# Patient Record
Sex: Female | Born: 1970 | Race: Black or African American | Hispanic: No | Marital: Single | Smoking: Current every day smoker
Health system: Southern US, Community
[De-identification: ages and names within clinical notes are randomized; demographics above are authoritative.]

## PROBLEM LIST (undated history)

## (undated) DIAGNOSIS — F1010 Alcohol abuse, uncomplicated: Secondary | ICD-10-CM

## (undated) DIAGNOSIS — I472 Ventricular tachycardia, unspecified: Principal | ICD-10-CM

## (undated) DIAGNOSIS — I5020 Unspecified systolic (congestive) heart failure: Secondary | ICD-10-CM

## (undated) DIAGNOSIS — I10 Essential (primary) hypertension: Secondary | ICD-10-CM

## (undated) DIAGNOSIS — I2510 Atherosclerotic heart disease of native coronary artery without angina pectoris: Secondary | ICD-10-CM

## (undated) HISTORY — DX: Unspecified systolic (congestive) heart failure: I50.20

## (undated) HISTORY — DX: Atherosclerotic heart disease of native coronary artery without angina pectoris: I25.10

---

## 2000-09-22 ENCOUNTER — Emergency Department (HOSPITAL_COMMUNITY): Admission: EM | Admit: 2000-09-22 | Discharge: 2000-09-22 | Payer: Self-pay | Admitting: Emergency Medicine

## 2000-11-05 ENCOUNTER — Encounter: Payer: Self-pay | Admitting: *Deleted

## 2000-11-05 ENCOUNTER — Emergency Department (HOSPITAL_COMMUNITY): Admission: EM | Admit: 2000-11-05 | Discharge: 2000-11-05 | Payer: Self-pay | Admitting: *Deleted

## 2001-10-17 ENCOUNTER — Other Ambulatory Visit: Admission: RE | Admit: 2001-10-17 | Discharge: 2001-10-17 | Payer: Self-pay

## 2002-06-14 ENCOUNTER — Ambulatory Visit (HOSPITAL_COMMUNITY): Admission: RE | Admit: 2002-06-14 | Discharge: 2002-06-14 | Payer: Self-pay | Admitting: Obstetrics and Gynecology

## 2002-06-14 ENCOUNTER — Encounter: Payer: Self-pay | Admitting: Obstetrics and Gynecology

## 2002-07-05 ENCOUNTER — Ambulatory Visit (HOSPITAL_COMMUNITY): Admission: RE | Admit: 2002-07-05 | Discharge: 2002-07-05 | Payer: Self-pay | Admitting: General Surgery

## 2002-08-04 ENCOUNTER — Encounter: Payer: Self-pay | Admitting: *Deleted

## 2002-08-04 ENCOUNTER — Emergency Department (HOSPITAL_COMMUNITY): Admission: EM | Admit: 2002-08-04 | Discharge: 2002-08-04 | Payer: Self-pay | Admitting: *Deleted

## 2002-11-26 ENCOUNTER — Emergency Department (HOSPITAL_COMMUNITY): Admission: EM | Admit: 2002-11-26 | Discharge: 2002-11-26 | Payer: Self-pay | Admitting: Emergency Medicine

## 2002-11-26 ENCOUNTER — Encounter: Payer: Self-pay | Admitting: Emergency Medicine

## 2003-03-24 ENCOUNTER — Emergency Department (HOSPITAL_COMMUNITY): Admission: EM | Admit: 2003-03-24 | Discharge: 2003-03-24 | Payer: Self-pay | Admitting: Emergency Medicine

## 2003-08-17 ENCOUNTER — Emergency Department (HOSPITAL_COMMUNITY): Admission: EM | Admit: 2003-08-17 | Discharge: 2003-08-17 | Payer: Self-pay | Admitting: Emergency Medicine

## 2003-10-18 ENCOUNTER — Ambulatory Visit (HOSPITAL_COMMUNITY): Admission: RE | Admit: 2003-10-18 | Discharge: 2003-10-18 | Payer: Self-pay | Admitting: Obstetrics and Gynecology

## 2004-06-15 ENCOUNTER — Emergency Department (HOSPITAL_COMMUNITY): Admission: EM | Admit: 2004-06-15 | Discharge: 2004-06-15 | Payer: Self-pay | Admitting: Emergency Medicine

## 2004-07-02 ENCOUNTER — Ambulatory Visit: Payer: Self-pay | Admitting: Orthopedic Surgery

## 2004-07-07 ENCOUNTER — Encounter (HOSPITAL_COMMUNITY): Admission: RE | Admit: 2004-07-07 | Discharge: 2004-08-06 | Payer: Self-pay | Admitting: Orthopedic Surgery

## 2005-01-26 ENCOUNTER — Emergency Department (HOSPITAL_COMMUNITY): Admission: EM | Admit: 2005-01-26 | Discharge: 2005-01-26 | Payer: Self-pay | Admitting: Emergency Medicine

## 2005-04-24 ENCOUNTER — Emergency Department (HOSPITAL_COMMUNITY): Admission: EM | Admit: 2005-04-24 | Discharge: 2005-04-24 | Payer: Self-pay | Admitting: Emergency Medicine

## 2006-01-28 ENCOUNTER — Emergency Department (HOSPITAL_COMMUNITY): Admission: EM | Admit: 2006-01-28 | Discharge: 2006-01-28 | Payer: Self-pay | Admitting: Emergency Medicine

## 2006-03-29 ENCOUNTER — Emergency Department (HOSPITAL_COMMUNITY): Admission: EM | Admit: 2006-03-29 | Discharge: 2006-03-29 | Payer: Self-pay | Admitting: Emergency Medicine

## 2008-01-16 ENCOUNTER — Emergency Department (HOSPITAL_COMMUNITY): Admission: EM | Admit: 2008-01-16 | Discharge: 2008-01-16 | Payer: Self-pay | Admitting: Emergency Medicine

## 2008-05-05 ENCOUNTER — Emergency Department (HOSPITAL_COMMUNITY): Admission: EM | Admit: 2008-05-05 | Discharge: 2008-05-05 | Payer: Self-pay | Admitting: Emergency Medicine

## 2009-03-29 ENCOUNTER — Emergency Department (HOSPITAL_COMMUNITY): Admission: EM | Admit: 2009-03-29 | Discharge: 2009-03-29 | Payer: Self-pay | Admitting: Emergency Medicine

## 2009-05-02 ENCOUNTER — Emergency Department (HOSPITAL_COMMUNITY): Admission: EM | Admit: 2009-05-02 | Discharge: 2009-05-02 | Payer: Self-pay | Admitting: Emergency Medicine

## 2010-06-18 ENCOUNTER — Emergency Department (HOSPITAL_COMMUNITY)
Admission: EM | Admit: 2010-06-18 | Discharge: 2010-06-18 | Payer: Self-pay | Source: Home / Self Care | Admitting: Emergency Medicine

## 2010-06-22 LAB — URINALYSIS, ROUTINE W REFLEX MICROSCOPIC
Bilirubin Urine: NEGATIVE
Ketones, ur: NEGATIVE mg/dL
Nitrite: NEGATIVE
Specific Gravity, Urine: 1.005 (ref 1.005–1.030)
Urine Glucose, Fasting: NEGATIVE mg/dL
Urobilinogen, UA: 0.2 mg/dL (ref 0.0–1.0)
pH: 5.5 (ref 5.0–8.0)

## 2010-06-22 LAB — URINE MICROSCOPIC-ADD ON

## 2010-06-22 LAB — PREGNANCY, URINE: Preg Test, Ur: NEGATIVE

## 2010-06-28 ENCOUNTER — Encounter: Payer: Self-pay | Admitting: Obstetrics and Gynecology

## 2010-10-01 ENCOUNTER — Emergency Department (HOSPITAL_COMMUNITY): Payer: Self-pay

## 2010-10-01 ENCOUNTER — Emergency Department (HOSPITAL_COMMUNITY)
Admission: EM | Admit: 2010-10-01 | Discharge: 2010-10-01 | Disposition: A | Discharge status: Home / Self Care | Payer: Self-pay | Attending: Emergency Medicine | Admitting: Emergency Medicine

## 2010-10-01 DIAGNOSIS — Y92009 Unspecified place in unspecified non-institutional (private) residence as the place of occurrence of the external cause: Secondary | ICD-10-CM | POA: Insufficient documentation

## 2010-10-01 DIAGNOSIS — S2239XA Fracture of one rib, unspecified side, initial encounter for closed fracture: Secondary | ICD-10-CM | POA: Insufficient documentation

## 2010-10-01 DIAGNOSIS — R079 Chest pain, unspecified: Secondary | ICD-10-CM | POA: Insufficient documentation

## 2010-10-01 DIAGNOSIS — W1809XA Striking against other object with subsequent fall, initial encounter: Secondary | ICD-10-CM | POA: Insufficient documentation

## 2010-10-23 NOTE — H&P (Signed)
   NAME:  Erika Kelley, Erika Kelley NO.:  [id number]   MEDICAL RECORD NO.:  [id number]                   PATIENT TYPE:   LOCATION:                                       FACILITY:   PHYSICIAN:  Dalia Heading, M.D.               DATE OF BIRTH:  1971/01/29   DATE OF ADMISSION:  DATE OF DISCHARGE:                                HISTORY & PHYSICAL   CHIEF COMPLAINT:  Right breast mass.   HISTORY OF PRESENT ILLNESS:  The patient is a 40 year old black female who  is referred for evaluation and treatment of a right breast mass.  It has  been present for over a year.  It is becoming increasingly tender.  Some  enlargement is noted.  No family history of breast carcinoma is noted.  She  was first pregnant at age 38, had two children, never breast fed.  No nipple  discharge is noted.   PAST MEDICAL HISTORY:  Unremarkable.   PAST SURGICAL HISTORY:  Unremarkable.   CURRENT MEDICATIONS:  None.   ALLERGIES:  No known drug allergies.   REVIEW OF SYSTEMS:  The patient does smoke a half pack of cigarettes a day.  She denies any significant alcohol use.  She denies any other  cardiopulmonary difficulties or bleeding disorders.   PHYSICAL EXAMINATION:  GENERAL:  The patient is a well-developed, well-  nourished black female in no acute distress.  VITAL SIGNS:  She is afebrile and vital signs are stable.  LUNGS:  Clear to auscultation with equal breath sounds bilaterally.  HEART:  Regular rate and rhythm without S3, S4, or murmurs.  BREASTS:  Right breast examination reveals a 1-cm ovoid subcutaneous nodule  noted along the inferior mammary fold.  No nipple discharge or dimpling is  noted.  The axilla is negative for palpable nodes.  Left breast examination  was unremarkable.   LABORATORY DATA:  Ultrasound of the area reveals a nonspecific subcutaneous  nodule along the inferior mammary fold.   IMPRESSION:  Right breast mass.    PLAN:  The patient is scheduled for  a right breast biopsy on July 05, 2002.  The risks and benefits of the procedure including bleeding and  infection were fully explained to the patient, gave informed consent.                                               Dalia Heading, M.D.    MAJ/MEDQ  D:  06/28/2002  T:  06/28/2002  Job:  [id number]   cc:   Health Department

## 2010-10-23 NOTE — Op Note (Signed)
   NAME:  Erika Kelley, Erika Kelley                      ACCOUNT NO.:  [id number]   MEDICAL RECORD NO.:  [id number]                   PATIENT TYPE:  AMB   LOCATION:  DAY                                  FACILITY:  [facility]   PHYSICIAN:  Dalia Heading, M.D.               DATE OF BIRTH:  [birth year]   DATE OF PROCEDURE:  07/05/2002  DATE OF DISCHARGE:                                 OPERATIVE REPORT   PREOPERATIVE DIAGNOSIS:  Right breast mass.   POSTOPERATIVE DIAGNOSIS:  Right breast mass.   PROCEDURE:  Right breast biopsy.   SURGEON:  Dalia Heading, M.D.   ANESTHESIA:  MAC.   INDICATIONS:  The patient is a 40 year old black female who presents with a  dominant mass along the inframammary fold of the right breast at the 6  o'clock position.  The risks and benefits of the procedure, including  bleeding and infection were fully explained to the patient, gained informed  consent.   PROCEDURE NOTE:  The patient was placed in the supine position.  The right  breast was prepped and draped using the usual sterile technique with  Betadine.  Surgical site confirmation was performed.   An incision was made along the inframammary fold at the 6 o'clock position  of the right breast.  The mass was excised without difficulty.  It appeared  to be necrotic breast tissue.  The specimen was sent to pathology for  further examination.  Any bleeding was controlled using Bovie  electrocautery.  The skin was closed using a 4-0 Vicryl subcuticular suture.   1% Xylocaine was used for local anesthesia.  Steri-Strips and a dry, sterile  dressing were applied.   All tape and needle counts were correct at the end of the procedure.  The  patient was transferred to the day surgery in stable condition.   COMPLICATIONS:  None.    SPECIMENS:  Right breast mass.   BLOOD LOSS:  Minimal.                                               Dalia Heading, M.D.    MAJ/MEDQ  D:  07/05/2002  T:  07/05/2002  Job:   [id number]   cc:   Health Department

## 2010-12-29 ENCOUNTER — Emergency Department (HOSPITAL_COMMUNITY)
Admission: EM | Admit: 2010-12-29 | Discharge: 2010-12-29 | Disposition: A | Discharge status: Home / Self Care | Payer: Self-pay | Attending: Emergency Medicine | Admitting: Emergency Medicine

## 2010-12-29 ENCOUNTER — Emergency Department (HOSPITAL_COMMUNITY): Payer: Self-pay

## 2010-12-29 DIAGNOSIS — R51 Headache: Secondary | ICD-10-CM | POA: Insufficient documentation

## 2010-12-29 DIAGNOSIS — S5000XA Contusion of unspecified elbow, initial encounter: Secondary | ICD-10-CM | POA: Insufficient documentation

## 2010-12-29 DIAGNOSIS — F17200 Nicotine dependence, unspecified, uncomplicated: Secondary | ICD-10-CM | POA: Insufficient documentation

## 2010-12-29 DIAGNOSIS — S40029A Contusion of unspecified upper arm, initial encounter: Secondary | ICD-10-CM | POA: Insufficient documentation

## 2010-12-29 DIAGNOSIS — M79609 Pain in unspecified limb: Secondary | ICD-10-CM | POA: Insufficient documentation

## 2010-12-29 DIAGNOSIS — M25529 Pain in unspecified elbow: Secondary | ICD-10-CM | POA: Insufficient documentation

## 2012-03-30 ENCOUNTER — Encounter (HOSPITAL_COMMUNITY): Payer: Self-pay | Admitting: Emergency Medicine

## 2012-03-30 ENCOUNTER — Emergency Department (HOSPITAL_COMMUNITY): Payer: Medicaid Other

## 2012-03-30 ENCOUNTER — Emergency Department (HOSPITAL_COMMUNITY)
Admission: EM | Admit: 2012-03-30 | Discharge: 2012-03-30 | Disposition: A | Discharge status: Home / Self Care | Payer: Medicaid Other | Attending: Emergency Medicine | Admitting: Emergency Medicine

## 2012-03-30 DIAGNOSIS — J40 Bronchitis, not specified as acute or chronic: Secondary | ICD-10-CM

## 2012-03-30 DIAGNOSIS — Z9889 Other specified postprocedural states: Secondary | ICD-10-CM | POA: Insufficient documentation

## 2012-03-30 DIAGNOSIS — F17200 Nicotine dependence, unspecified, uncomplicated: Secondary | ICD-10-CM | POA: Insufficient documentation

## 2012-03-30 DIAGNOSIS — M549 Dorsalgia, unspecified: Secondary | ICD-10-CM | POA: Insufficient documentation

## 2012-03-30 LAB — URINALYSIS, ROUTINE W REFLEX MICROSCOPIC
Bilirubin Urine: NEGATIVE
Glucose, UA: NEGATIVE mg/dL
Ketones, ur: NEGATIVE mg/dL
Nitrite: NEGATIVE
Protein, ur: NEGATIVE mg/dL
Specific Gravity, Urine: 1.02 (ref 1.005–1.030)
Urobilinogen, UA: 0.2 mg/dL (ref 0.0–1.0)
pH: 6.5 (ref 5.0–8.0)

## 2012-03-30 LAB — COMPREHENSIVE METABOLIC PANEL
ALT: 13 U/L (ref 0–35)
AST: 15 U/L (ref 0–37)
Albumin: 3.8 g/dL (ref 3.5–5.2)
Alkaline Phosphatase: 71 U/L (ref 39–117)
BUN: 9 mg/dL (ref 6–23)
CO2: 23 mEq/L (ref 19–32)
Calcium: 9.1 mg/dL (ref 8.4–10.5)
Chloride: 101 mEq/L (ref 96–112)
Creatinine, Ser: 0.69 mg/dL (ref 0.50–1.10)
GFR calc Af Amer: >90 mL/min (ref >90)
GFR calc non Af Amer: >90 mL/min (ref >90)
Glucose, Bld: 87 mg/dL (ref 70–99)
Potassium: 3.5 mEq/L (ref 3.5–5.1)
Sodium: 135 mEq/L (ref 135–145)
Total Bilirubin: 0.3 mg/dL (ref 0.3–1.2)
Total Protein: 8.4 g/dL — ABNORMAL HIGH (ref 6.0–8.3)

## 2012-03-30 LAB — CBC WITH DIFFERENTIAL/PLATELET
Basophils Absolute: 0 10*3/uL (ref 0.0–0.1)
Basophils Relative: 0 % (ref 0–1)
Eosinophils Absolute: 0.1 10*3/uL (ref 0.0–0.7)
Eosinophils Relative: 1 % (ref 0–5)
HCT: 40.4 % (ref 36.0–46.0)
Hemoglobin: 13.4 g/dL (ref 12.0–15.0)
Lymphocytes Relative: 26 % (ref 12–46)
Lymphs Abs: 2.8 10*3/uL (ref 0.7–4.0)
MCH: 26.7 pg (ref 26.0–34.0)
MCHC: 33.2 g/dL (ref 30.0–36.0)
MCV: 80.5 fL (ref 78.0–100.0)
Monocytes Absolute: 1.3 10*3/uL — ABNORMAL HIGH (ref 0.1–1.0)
Monocytes Relative: 12 % (ref 3–12)
Neutro Abs: 6.7 10*3/uL (ref 1.7–7.7)
Neutrophils Relative %: 61 % (ref 43–77)
Platelets: 297 10*3/uL (ref 150–400)
RBC: 5.02 MIL/uL (ref 3.87–5.11)
RDW: 13.3 % (ref 11.5–15.5)
WBC: 10.9 10*3/uL — ABNORMAL HIGH (ref 4.0–10.5)

## 2012-03-30 LAB — LIPASE, BLOOD: Lipase: 45 U/L (ref 11–59)

## 2012-03-30 LAB — D-DIMER, QUANTITATIVE: D-Dimer, Quant: 0.68 ug/mL-FEU — ABNORMAL HIGH (ref 0.00–0.48)

## 2012-03-30 LAB — URINE MICROSCOPIC-ADD ON

## 2012-03-30 LAB — PREGNANCY, URINE: Preg Test, Ur: NEGATIVE

## 2012-03-30 MED ORDER — KETOROLAC TROMETHAMINE 30 MG/ML IJ SOLN
30.0000 mg | Freq: Once | INTRAMUSCULAR | Status: AC
  Administered 2012-03-30: 30 mg via INTRAVENOUS
  Filled 2012-03-30: qty 1

## 2012-03-30 MED ORDER — NAPROXEN 500 MG PO TABS: 500.0000 mg | ORAL_TABLET | Freq: Two times a day (BID) | ORAL | Status: DC

## 2012-03-30 MED ORDER — ONDANSETRON HCL 4 MG/2ML IJ SOLN
4.0000 mg | Freq: Once | INTRAMUSCULAR | Status: AC
  Administered 2012-03-30: 4 mg via INTRAVENOUS
  Filled 2012-03-30: qty 2

## 2012-03-30 MED ORDER — AZITHROMYCIN 250 MG PO TABS
500.0000 mg | ORAL_TABLET | Freq: Once | ORAL | Status: AC
  Administered 2012-03-30: 500 mg via ORAL
  Filled 2012-03-30: qty 2
  Filled 2012-03-30: qty 1

## 2012-03-30 MED ORDER — MORPHINE SULFATE 4 MG/ML IJ SOLN
4.0000 mg | Freq: Once | INTRAMUSCULAR | Status: AC
  Administered 2012-03-30: 4 mg via INTRAVENOUS
  Filled 2012-03-30: qty 1

## 2012-03-30 MED ORDER — HYDROCODONE-ACETAMINOPHEN 5-325 MG PO TABS: ORAL_TABLET | ORAL | Status: DC

## 2012-03-30 MED ORDER — SODIUM CHLORIDE 0.9 % IV SOLN
Freq: Once | INTRAVENOUS | Status: AC
  Administered 2012-03-30: 20:00:00 via INTRAVENOUS

## 2012-03-30 MED ORDER — AZITHROMYCIN 250 MG PO TABS: ORAL_TABLET | ORAL | Status: DC

## 2012-03-30 NOTE — ED Notes (Signed)
Patient complaining of sharp pains in LUQ. Reports pains started this morning and radiate to back.

## 2012-03-30 NOTE — ED Provider Notes (Signed)
History     CSN: [id number]  Arrival date & time 03/30/12  Erika Kelley   First MD Initiated Contact with Patient 03/30/12 1934      Chief Complaint  Patient presents with  . Abdominal Pain    (Consider location/radiation/quality/duration/timing/severity/associated sxs/prior treatment) HPI Comments: Patient c/o sudden onset of LUQ and left rib pain that began suddenly this morning.  Describes the pain as sharp and stabbing and worse with movement and deep breathing.  Also states the pain radiates into her back.  She states she recently had a "cold" with frequent coughing. Cough has subsided, but still reports having an occasional cough.  She denies fever, vomiting, nausea or diarrhea.  Pain is not associated with food intake.  She also denies chest pain, sweating or shortness of breath.  She admits to occasional alcohol use.  Patient is a 41 y.o. female presenting with abdominal pain. The history is provided by the patient.  Abdominal Pain The primary symptoms of the illness include abdominal pain. The primary symptoms of the illness do not include fever, fatigue, shortness of breath, nausea, vomiting, diarrhea, hematemesis, hematochezia, dysuria, vaginal discharge or vaginal bleeding. The current episode started 6 to 12 hours ago. The onset of the illness was sudden. The problem has not changed since onset. The pain came on suddenly. The abdominal pain has been unchanged since its onset. The abdominal pain is located in the LUQ. The abdominal pain radiates to the back. The abdominal pain is relieved by nothing. Exacerbated by: nothing.  The patient states that she believes she is currently not pregnant. The patient has not had a change in bowel habit. Additional symptoms associated with the illness include back pain. Symptoms associated with the illness do not include chills, anorexia, heartburn, constipation, urgency, hematuria or frequency.    History reviewed. No pertinent past medical  history.  Past Surgical History  Procedure Date  . Cesarean section     History reviewed. No pertinent family history.  History  Substance Use Topics  . Smoking status: Current Every Day Smoker  . Smokeless tobacco: Not on file  . Alcohol Use: Yes    OB History    Grav Para Term Preterm Abortions TAB SAB Ect Mult Living                  Review of Systems  Constitutional: Negative for fever, chills, appetite change and fatigue.  Respiratory: Negative for chest tightness and shortness of breath.   Cardiovascular: Negative for chest pain.  Gastrointestinal: Positive for abdominal pain. Negative for heartburn, nausea, vomiting, diarrhea, constipation, blood in stool, hematochezia, anorexia and hematemesis.  Genitourinary: Negative for dysuria, urgency, frequency, hematuria, flank pain, decreased urine volume, vaginal bleeding, vaginal discharge and difficulty urinating.  Musculoskeletal: Positive for back pain.  Skin: Negative for color change and rash.  Neurological: Negative for dizziness, weakness and numbness.  Hematological: Negative for adenopathy.  All other systems reviewed and are negative.    Allergies  Review of patient's allergies indicates no known allergies.  Home Medications  No current outpatient prescriptions on file.  BP 142/90  Pulse 109  Temp 99 F (37.2 C) (Oral)  Resp 18  SpO2 95%  LMP 03/16/2012  Physical Exam  Nursing note and vitals reviewed. Constitutional: She is oriented to person, place, and time. She appears well-developed and well-nourished. No distress.  HENT:  Head: Normocephalic and atraumatic.  Mouth/Throat: Oropharynx is clear and moist.  Cardiovascular: Normal rate, regular rhythm, normal heart sounds and intact  distal pulses.   No murmur heard. Pulmonary/Chest: Effort normal. No respiratory distress. She has no wheezes. She has no rales. She exhibits tenderness.       ttp of the lower anterior left ribs and left lateral  ribs.  No crepitus or guarding.  Slightly course lung sounds bilaterally  Abdominal: Soft. Bowel sounds are normal. She exhibits no distension and no mass. There is no hepatosplenomegaly. There is tenderness in the left upper quadrant. There is no rigidity, no rebound, no guarding and no CVA tenderness.         Mild ttp of the left upper quadrant.  No right sided tenderness or lower abdominal tenderness.  No guarding or rebound tenderness.    Musculoskeletal: She exhibits no edema.  Neurological: She is alert and oriented to person, place, and time.  Skin: Skin is warm and dry.    ED Course  Procedures (including critical care time)    Results for orders placed during the hospital encounter of 03/30/12  CBC WITH DIFFERENTIAL      Component Value Range   WBC 10.9 (*) 4.0 - 10.5 K/uL   RBC 5.02  3.87 - 5.11 MIL/uL   Hemoglobin 13.4  12.0 - 15.0 g/dL   HCT 16.1  09.6 - 04.5 %   MCV 80.5  78.0 - 100.0 fL   MCH 26.7  26.0 - 34.0 pg   MCHC 33.2  30.0 - 36.0 g/dL   RDW 40.9  81.1 - 91.4 %   Platelets 297  150 - 400 K/uL   Neutrophils Relative 61  43 - 77 %   Neutro Abs 6.7  1.7 - 7.7 K/uL   Lymphocytes Relative 26  12 - 46 %   Lymphs Abs 2.8  0.7 - 4.0 K/uL   Monocytes Relative 12  3 - 12 %   Monocytes Absolute 1.3 (*) 0.1 - 1.0 K/uL   Eosinophils Relative 1  0 - 5 %   Eosinophils Absolute 0.1  0.0 - 0.7 K/uL   Basophils Relative 0  0 - 1 %   Basophils Absolute 0.0  0.0 - 0.1 K/uL  COMPREHENSIVE METABOLIC PANEL      Component Value Range   Sodium 135  135 - 145 mEq/L   Potassium 3.5  3.5 - 5.1 mEq/L   Chloride 101  96 - 112 mEq/L   CO2 23  19 - 32 mEq/L   Glucose, Bld 87  70 - 99 mg/dL   BUN 9  6 - 23 mg/dL   Creatinine, Ser 7.82  0.50 - 1.10 mg/dL   Calcium 9.1  8.4 - 95.6 mg/dL   Total Protein 8.4 (*) 6.0 - 8.3 g/dL   Albumin 3.8  3.5 - 5.2 g/dL   AST 15  0 - 37 U/L   ALT 13  0 - 35 U/L   Alkaline Phosphatase 71  39 - 117 U/L   Total Bilirubin 0.3  0.3 - 1.2 mg/dL    GFR calc non Af Amer >90  >90 mL/min   GFR calc Af Amer >90  >90 mL/min  LIPASE, BLOOD      Component Value Range   Lipase 45  11 - 59 U/L  URINALYSIS, ROUTINE W REFLEX MICROSCOPIC      Component Value Range   Color, Urine YELLOW  YELLOW   APPearance CLEAR  CLEAR   Specific Gravity, Urine 1.020  1.005 - 1.030   pH 6.5  5.0 - 8.0   Glucose, UA NEGATIVE  NEGATIVE mg/dL   Hgb urine dipstick TRACE (*) NEGATIVE   Bilirubin Urine NEGATIVE  NEGATIVE   Ketones, ur NEGATIVE  NEGATIVE mg/dL   Protein, ur NEGATIVE  NEGATIVE mg/dL   Urobilinogen, UA 0.2  0.0 - 1.0 mg/dL   Nitrite NEGATIVE  NEGATIVE   Leukocytes, UA TRACE (*) NEGATIVE  PREGNANCY, URINE      Component Value Range   Preg Test, Ur NEGATIVE  NEGATIVE  URINE MICROSCOPIC-ADD ON      Component Value Range   Squamous Epithelial / LPF MANY (*) RARE   WBC, UA 3-6  <3 WBC/hpf   RBC / HPF 0-2  <3 RBC/hpf   Bacteria, UA FEW (*) RARE  D-DIMER, QUANTITATIVE      Component Value Range   D-Dimer, Quant 0.68 (*) 0.00 - 0.48 ug/mL-FEU     Dg Chest 2 View  03/30/2012  *RADIOLOGY REPORT*  Clinical Data: Left upper quadrant abdominal pain.  CHEST - 2 VIEW  Comparison: Two-view chest 10/01/2010.  Findings: The heart size is normal.  Bilateral infrahilar bronchitic changes are evident.  Bibasilar atelectasis is evident. Early infection is not excluded.  The upper lung fields are clear. The visualized soft tissues and bony thorax are unremarkable.  IMPRESSION:  1.  Bilateral infrahilar bronchitic change.  Part of this may be chronic although there has been some progression and acute bronchitis is suspected. 2.  Bibasilar airspace disease likely reflects atelectasis.  Early bronchopneumonia is not excluded.   Original Report Authenticated By: Jamesetta Orleans. MATTERN, M.D.     MDM    Patient is resting comfortably, feeling better.  Lung sounds were slightly coarse but improve after cough.   No wheezing or rales. Doubt acute abdomen.  Labs and imaging  reviewed, discussed pt hx and results with EDP.    D-Dimer is slightly elevated, but clinical suspicion for PE is low.  Pt is a smoker but no other significant risk factors.  Wells score is low probability for PE   Pt agrees to close f/u with her PMD or to return here if her sx's worsen.  Prescribed: zithromax Naprosyn norco #20    Erika Kelley L. [place], [place] 04/01/12 9147

## 2012-03-30 NOTE — ED Notes (Signed)
Patient c/o sharp pain in upper left quadrant. States that it is worse when she takes a breath, and hurts with certain movements, tender to palpate. Patient has no complaint of n/v, states that it just started this morning and has continued to get worse.

## 2012-04-01 LAB — URINE CULTURE: Colony Count: >=100000

## 2012-04-06 NOTE — ED Provider Notes (Signed)
Medical screening examination/treatment/procedure(s) were performed by non-physician practitioner and as supervising physician I was immediately available for consultation/collaboration. Devoria Albe, MD, Armando Gang   Ward Givens, MD 04/06/12 [phone]

## 2013-03-01 ENCOUNTER — Encounter (HOSPITAL_COMMUNITY): Payer: Self-pay

## 2013-03-01 ENCOUNTER — Emergency Department (HOSPITAL_COMMUNITY)
Admission: EM | Admit: 2013-03-01 | Discharge: 2013-03-01 | Disposition: A | Discharge status: Home / Self Care | Payer: Self-pay | Attending: Emergency Medicine | Admitting: Emergency Medicine

## 2013-03-01 ENCOUNTER — Emergency Department (HOSPITAL_COMMUNITY): Payer: Self-pay

## 2013-03-01 DIAGNOSIS — Y929 Unspecified place or not applicable: Secondary | ICD-10-CM | POA: Insufficient documentation

## 2013-03-01 DIAGNOSIS — S62339A Displaced fracture of neck of unspecified metacarpal bone, initial encounter for closed fracture: Secondary | ICD-10-CM | POA: Insufficient documentation

## 2013-03-01 DIAGNOSIS — F17200 Nicotine dependence, unspecified, uncomplicated: Secondary | ICD-10-CM | POA: Insufficient documentation

## 2013-03-01 DIAGNOSIS — IMO0002 Reserved for concepts with insufficient information to code with codable children: Secondary | ICD-10-CM | POA: Insufficient documentation

## 2013-03-01 DIAGNOSIS — Y9389 Activity, other specified: Secondary | ICD-10-CM | POA: Insufficient documentation

## 2013-03-01 MED ORDER — IBUPROFEN 600 MG PO TABS: 600.0000 mg | ORAL_TABLET | Freq: Four times a day (QID) | ORAL | Status: DC | PRN

## 2013-03-01 MED ORDER — HYDROCODONE-ACETAMINOPHEN 5-325 MG PO TABS: 1.0000 | ORAL_TABLET | ORAL | Status: DC | PRN

## 2013-03-01 MED ORDER — HYDROCODONE-ACETAMINOPHEN 5-325 MG PO TABS
1.0000 | ORAL_TABLET | Freq: Once | ORAL | Status: AC
  Administered 2013-03-01: 1 via ORAL
  Filled 2013-03-01: qty 1

## 2013-03-01 NOTE — ED Notes (Signed)
Pt reports was trying to break up 2 fighting dogs last night and r hand started hurting and swelling.  Pt says doesn't remember hitting her hand on anything.

## 2013-03-01 NOTE — ED Notes (Signed)
Patient with no complaints at this time. Respirations even and unlabored. Skin warm/dry. Discharge instructions reviewed with patient at this time. Patient given opportunity to voice concerns/ask questions. Patient discharged at this time and left Emergency Department with steady gait.   

## 2013-03-01 NOTE — ED Provider Notes (Signed)
CSN: [id number]     Arrival date & time 03/01/13  1001 History   First MD Initiated Contact with Patient 03/01/13 1020     Chief Complaint  Patient presents with  . Hand Pain   (Consider location/radiation/quality/duration/timing/severity/associated sxs/prior Treatment) HPI Comments: Erika Kelley is a 42 y.o. Female presenting with pain and swelling in her right hand which occurred when she hit her hand against a tree in her yard as she was trying to keep her pit bulls from fighting.  She reports increased pain and swelling despite using ibuprofen for pain relief.  She denies numbness in her hand or fingertips but has increased pain with attempts to flex and extend her fingers.  Pain is constant and worse with movement.  She has found no alleviators of her pain.      The history is provided by the patient.    History reviewed. No pertinent past medical history. Past Surgical History  Procedure Laterality Date  . Cesarean section     No family history on file. History  Substance Use Topics  . Smoking status: Current Every Day Smoker  . Smokeless tobacco: Not on file  . Alcohol Use: Yes     Comment: occ   OB History   Grav Para Term Preterm Abortions TAB SAB Ect Mult Living                 Review of Systems  Constitutional: Negative for fever.  Musculoskeletal: Positive for joint swelling and arthralgias. Negative for myalgias.  Neurological: Negative for weakness and numbness.    Allergies  Review of patient's allergies indicates no known allergies.  Home Medications   Current Outpatient Rx  Name  Route  Sig  Dispense  Refill  . HYDROcodone-acetaminophen (NORCO/VICODIN) 5-325 MG per tablet   Oral   Take 1 tablet by mouth every 4 (four) hours as needed for pain.   30 tablet   0   . ibuprofen (ADVIL,MOTRIN) 600 MG tablet   Oral   Take 1 tablet (600 mg total) by mouth every 6 (six) hours as needed for pain.   20 tablet   0    BP 119/91  Pulse 128   Temp(Src) 98.8 F (37.1 C) (Oral)  Resp 18  Ht 5\' 6"  (1.676 m)  Wt 170 lb (77.111 kg)  BMI 27.45 kg/m2  SpO2 97%  LMP 02/06/2013 Physical Exam  Constitutional: She appears well-developed and well-nourished.  HENT:  Head: Atraumatic.  Neck: Normal range of motion.  Cardiovascular:  Pulses:      Radial pulses are 2+ on the right side, and 2+ on the left side.  Pulses equal bilaterally, less than 3 second cap refill in fingertips.  Musculoskeletal: She exhibits edema and tenderness.       Hands: Neurological: She is alert. She has normal strength. She displays normal reflexes. No sensory deficit.  Equal strength  Skin: Skin is warm and dry.  Psychiatric: She has a normal mood and affect.    ED Course  Procedures (including critical care time) Labs Review Labs Reviewed - No data to display Imaging Review Dg Hand Complete Right  03/01/2013   CLINICAL DATA:  Right hand injury.  EXAM: RIGHT HAND - COMPLETE 3+ VIEW  COMPARISON:  None.  FINDINGS: There is a boxer's type fracture involving the 5th metacarpal neck with dorsal apex angulation. The remaining bony structures are intact. The joint spaces are maintained.  IMPRESSION: Boxer's type fracture involving the 5th metacarpal neck.  Electronically Signed   By: Loralie Champagne M.D.   On: 03/01/2013 10:45    MDM   1. Boxer's fracture, closed, initial encounter    Patient was placed in the ulnar gutter splint.  She was prescribed hydrocodone and ibuprofen.  Encouraged ice and elevation for the next several days to help minimize swelling.  She was referred to Dr. Romeo Apple for followup care of this injury.  She was examined post splint application.  She had less than 3 second cap refill, no increased pain after application of splint.    Burgess Amor, PA-C 03/01/13 1059

## 2013-03-02 NOTE — ED Provider Notes (Signed)
Medical screening examination/treatment/procedure(s) were conducted as a shared visit with non-physician practitioner(s) and myself.  I personally evaluated the patient during the encounter.   Right-sided boxer's fracture. Neurovascular intact. Splint, ice, pain medicine, orthopedic followup  Donnetta Hutching, MD 03/02/13 1537

## 2013-03-03 ENCOUNTER — Encounter (HOSPITAL_COMMUNITY): Payer: Self-pay

## 2013-03-03 ENCOUNTER — Emergency Department (HOSPITAL_COMMUNITY)
Admission: EM | Admit: 2013-03-03 | Discharge: 2013-03-03 | Disposition: A | Discharge status: Home / Self Care | Payer: Self-pay | Attending: Emergency Medicine | Admitting: Emergency Medicine

## 2013-03-03 DIAGNOSIS — F17200 Nicotine dependence, unspecified, uncomplicated: Secondary | ICD-10-CM | POA: Insufficient documentation

## 2013-03-03 DIAGNOSIS — S5290XD Unspecified fracture of unspecified forearm, subsequent encounter for closed fracture with routine healing: Secondary | ICD-10-CM | POA: Insufficient documentation

## 2013-03-03 DIAGNOSIS — G8911 Acute pain due to trauma: Secondary | ICD-10-CM | POA: Insufficient documentation

## 2013-03-03 DIAGNOSIS — M79641 Pain in right hand: Secondary | ICD-10-CM

## 2013-03-03 DIAGNOSIS — S62309S Unspecified fracture of unspecified metacarpal bone, sequela: Secondary | ICD-10-CM

## 2013-03-03 MED ORDER — OXYCODONE-ACETAMINOPHEN 5-325 MG PO TABS: 1.0000 | ORAL_TABLET | ORAL | Status: DC | PRN

## 2013-03-03 MED ORDER — OXYCODONE-ACETAMINOPHEN 5-325 MG PO TABS
1.0000 | ORAL_TABLET | Freq: Once | ORAL | Status: AC
  Administered 2013-03-03: 1 via ORAL
  Filled 2013-03-03: qty 1

## 2013-03-03 NOTE — ED Provider Notes (Signed)
CSN: [id number]     Arrival date & time 03/03/13  0909 History   First MD Initiated Contact with Patient 03/03/13 [phone]     Chief Complaint  Patient presents with  . Hand Pain   (Consider location/radiation/quality/duration/timing/severity/associated sxs/prior Treatment) HPI  Erika Kelley is a 42 y.o. female who presents to the ED for pain in her right hand. She was evaluated here 2 days ago and x-ray showed a boxers fracture. Today she complains of increased pain and swelling. She has an appointment with Dr. Romeo Apple on Monday, 03/05/13.    History reviewed. No pertinent past medical history. Past Surgical History  Procedure Laterality Date  . Cesarean section     History reviewed. No pertinent family history. History  Substance Use Topics  . Smoking status: Current Every Day Smoker  . Smokeless tobacco: Not on file  . Alcohol Use: Yes     Comment: occ   OB History   Grav Para Term Preterm Abortions TAB SAB Ect Mult Living                 Review of Systems  Constitutional: Negative for fever.  HENT: Negative for neck pain.   Gastrointestinal: Negative for nausea and vomiting.  Musculoskeletal:       Right hand pain  Skin: Negative for wound.  Psychiatric/Behavioral: The patient is not nervous/anxious.     Allergies  Review of patient's allergies indicates no known allergies.  Home Medications   Current Outpatient Rx  Name  Route  Sig  Dispense  Refill  . HYDROcodone-acetaminophen (NORCO/VICODIN) 5-325 MG per tablet   Oral   Take 1 tablet by mouth every 4 (four) hours as needed for pain.   30 tablet   0   . ibuprofen (ADVIL,MOTRIN) 600 MG tablet   Oral   Take 1 tablet (600 mg total) by mouth every 6 (six) hours as needed for pain.   20 tablet   0    BP 152/94  Pulse 65  Temp(Src) 98.2 F (36.8 C) (Oral)  Resp 17  Ht 5\' 6"  (1.676 m)  Wt 170 lb (77.111 kg)  BMI 27.45 kg/m2  SpO2 100%  LMP 02/06/2013 Physical Exam  Nursing note and vitals  reviewed. Constitutional: She is oriented to person, place, and time. She appears well-developed and well-nourished. No distress.  HENT:  Head: Atraumatic.  Eyes: EOM are normal.  Neck: Neck supple.  Pulmonary/Chest: Effort normal.  Musculoskeletal:       Right hand: She exhibits decreased range of motion, tenderness and bony tenderness. She exhibits normal capillary refill, no deformity and no laceration. Swelling: mild. Normal sensation noted. Normal strength noted.       Hands: Splint removed to examine the patient's right hand. Radial pulse strong, adequate circulation, good touch sensation. Mild swelling noted over 4th MC. Tender with palpation.  Neurological: She is alert and oriented to person, place, and time. No cranial nerve deficit.  Skin: Skin is warm and dry.  Psychiatric: She has a normal mood and affect. Her behavior is normal.    ED Course  Procedures Dg Hand Complete Right  03/01/2013   CLINICAL DATA:  Right hand injury.  EXAM: RIGHT HAND - COMPLETE 3+ VIEW  COMPARISON:  None.  FINDINGS: There is a boxer's type fracture involving the 5th metacarpal neck with dorsal apex angulation. The remaining bony structures are intact. The joint spaces are maintained.  IMPRESSION: Boxer's type fracture involving the 5th metacarpal neck.   Electronically Signed  By: Loralie Champagne M.D.   On: 03/01/2013 10:45   Patient feeling better after elevating the injured area and Percocet.  MDM  42 y.o. female here for follow up of Boxer's Fracture right. Splint re applied, area elevated, ice pack applied. Patient remains neurovascularly intact. No concerns for compartment syndrome at this time. She is to keep her appointment with Dr. Romeo Apple on Monday 03/05/13. Detailed instructions to the patient concerning return if she develops severe pain, swelling, fingers feeling numb or cold other problems. Patient voices understanding.      [facility] Orlene Och, [place] 03/03/13 1014

## 2013-03-03 NOTE — ED Provider Notes (Signed)
Medical screening examination/treatment/procedure(s) were performed by non-physician practitioner and as supervising physician I was immediately available for consultation/collaboration. Devoria Albe, MD, FACEP   Ward Givens, MD 03/03/13 1018

## 2013-03-03 NOTE — ED Notes (Signed)
Pt states she was here Thursday for a boxers fracture. States today her right had is swollen and painful. Has an appointment with Dr. Romeo Apple Monday.

## 2013-03-03 NOTE — ED Notes (Signed)
Pt was seen in er a few days ago, diagnosed with boxer's fracture, states that the pain became worse last night, Hope NP in prior to RN, see PA assessment for further,

## 2013-03-05 ENCOUNTER — Telehealth: Payer: Self-pay | Admitting: Orthopedic Surgery

## 2013-03-05 ENCOUNTER — Ambulatory Visit: Payer: Self-pay | Admitting: Orthopedic Surgery

## 2013-03-05 NOTE — Telephone Encounter (Signed)
Patient has called back to relay that she contacted Affordable Care Act toll free phone#, and was told she was approved and that paperwork would be sent to her in the next 1 to 2 days.  She will stay in contact with our office; appointment offered again, as self pay; pending self-pay Vs awaiting possible insurance coverage.

## 2013-03-05 NOTE — Telephone Encounter (Signed)
Patient was given an appointment for today (03/05/13), per call from patient 03/02/13, following Emergency Room treatment at [facility] on 03/01/13, for Right hand fracture.  She had called back today, 03/05/13, almost 30 minutes after scheduled appointment time, relaying that she thought she would be able to have the minimum payment as discussed, however, was unable to do so.  She is working on payment arrangements as well as Tour[employer] information [employer] manager, although states had previously been told by them that she would have to try to apply for disability; also checking into Affordable care act.  Appointment for today has been cancelled, pending as noted.

## 2013-03-06 NOTE — Telephone Encounter (Signed)
Dr. Romeo Apple, per Doneen Poisson, please review Emergency Room patient, problem of hand fracture, DOI 03/01/13, for appointment this week, or Monday of next week?  Patient aware of self-pay minimum requirements.

## 2013-03-06 NOTE — Telephone Encounter (Signed)
Forwarding to [place] [employer] as reviewed with patient.

## 2013-03-07 NOTE — Telephone Encounter (Signed)
Wait for authorization

## 2013-03-07 NOTE — Telephone Encounter (Signed)
Patient called back today, states thinks can have payment arranged by tomorrow; I offered appointment in cancellation slot, states will call back shortly if she can come.  No reply back; I called back to follow up as cannot guarantee slot after today, and no voice message set up on patient's phone,

## 2013-03-13 ENCOUNTER — Encounter (HOSPITAL_COMMUNITY): Payer: Self-pay | Admitting: *Deleted

## 2013-03-13 ENCOUNTER — Emergency Department (HOSPITAL_COMMUNITY)
Admission: EM | Admit: 2013-03-13 | Discharge: 2013-03-13 | Disposition: A | Discharge status: Home / Self Care | Payer: Self-pay | Attending: Emergency Medicine | Admitting: Emergency Medicine

## 2013-03-13 DIAGNOSIS — F17200 Nicotine dependence, unspecified, uncomplicated: Secondary | ICD-10-CM | POA: Insufficient documentation

## 2013-03-13 DIAGNOSIS — M79641 Pain in right hand: Secondary | ICD-10-CM

## 2013-03-13 DIAGNOSIS — M25549 Pain in joints of unspecified hand: Secondary | ICD-10-CM | POA: Insufficient documentation

## 2013-03-13 DIAGNOSIS — G8911 Acute pain due to trauma: Secondary | ICD-10-CM | POA: Insufficient documentation

## 2013-03-13 DIAGNOSIS — S62309S Unspecified fracture of unspecified metacarpal bone, sequela: Secondary | ICD-10-CM

## 2013-03-13 MED ORDER — KETOROLAC TROMETHAMINE 10 MG PO TABS
10.0000 mg | ORAL_TABLET | Freq: Once | ORAL | Status: AC
  Administered 2013-03-13: 10 mg via ORAL
  Filled 2013-03-13: qty 1

## 2013-03-13 MED ORDER — OXYCODONE-ACETAMINOPHEN 5-325 MG PO TABS: 1.0000 | ORAL_TABLET | Freq: Four times a day (QID) | ORAL | Status: DC | PRN

## 2013-03-13 NOTE — ED Provider Notes (Signed)
CSN: [id number]     Arrival date & time 03/13/13  1013 History   First MD Initiated Contact with Patient 03/13/13 1030     Chief Complaint  Patient presents with  . Hand Injury   (Consider location/radiation/quality/duration/timing/severity/associated sxs/prior Treatment) HPI Comments: Pt sustained a boxer fracture of the right hand on 9/25. She was placed in a splint and treated with pain meds. She was seen on 9/27 and rechecked. Pain meds changed. Pt was scheduled to see Dr Romeo Apple, but does not have the office fee. She is applying for assistance with the Affordable Care Act, and waiting for paper work. She states he splint got wet and she took the splint off. States aleve and tylenol not helping.   She presents today with c/o increasing pain.  Patient is a 42 y.o. female presenting with hand injury. The history is provided by the patient.  Hand Injury Associated symptoms: no back pain and no neck pain     History reviewed. No pertinent past medical history. Past Surgical History  Procedure Laterality Date  . Cesarean section     History reviewed. No pertinent family history. History  Substance Use Topics  . Smoking status: Current Every Day Smoker  . Smokeless tobacco: Not on file  . Alcohol Use: Yes     Comment: occ   OB History   Grav Para Term Preterm Abortions TAB SAB Ect Mult Living                 Review of Systems  Constitutional: Negative for activity change.       All ROS Neg except as noted in HPI  HENT: Negative for nosebleeds and neck pain.   Eyes: Negative for photophobia and discharge.  Respiratory: Negative for cough, shortness of breath and wheezing.   Cardiovascular: Negative for chest pain and palpitations.  Gastrointestinal: Negative for abdominal pain and blood in stool.  Genitourinary: Negative for dysuria, frequency and hematuria.  Musculoskeletal: Negative for back pain and arthralgias.  Skin: Negative.   Neurological: Negative for dizziness,  seizures and speech difficulty.  Psychiatric/Behavioral: Negative for hallucinations and confusion.    Allergies  Review of patient's allergies indicates no known allergies.  Home Medications   Current Outpatient Rx  Name  Route  Sig  Dispense  Refill  . ibuprofen (ADVIL,MOTRIN) 600 MG tablet   Oral   Take 1 tablet (600 mg total) by mouth every 6 (six) hours as needed for pain.   20 tablet   0   . naproxen sodium (ALEVE) 220 MG tablet   Oral   Take 220 mg by mouth daily as needed. pain          BP 144/104  Pulse 88  Temp(Src) 98.2 F (36.8 C) (Oral)  Resp 18  SpO2 100%  LMP 03/07/2013 Physical Exam  Nursing note and vitals reviewed. Constitutional: She is oriented to person, place, and time. She appears well-developed and well-nourished.  Non-toxic appearance.  HENT:  Head: Normocephalic.  Right Ear: Tympanic membrane and external ear normal.  Left Ear: Tympanic membrane and external ear normal.  Eyes: EOM and lids are normal. Pupils are equal, round, and reactive to light.  Neck: Normal range of motion. Neck supple. Carotid bruit is not present.  Cardiovascular: Normal rate, regular rhythm, normal heart sounds, intact distal pulses and normal pulses.   Pulmonary/Chest: Breath sounds normal. No respiratory distress.  Abdominal: Soft. Bowel sounds are normal. There is no tenderness. There is no guarding.  Musculoskeletal:  Normal range of motion.       Hands: Lymphadenopathy:       Head (right side): No submandibular adenopathy present.       Head (left side): No submandibular adenopathy present.    She has no cervical adenopathy.  Neurological: She is alert and oriented to person, place, and time. She has normal strength. No cranial nerve deficit or sensory deficit.  Skin: Skin is warm and dry.  Psychiatric: She has a normal mood and affect. Her speech is normal.    ED Course  Procedures (including critical care time) Labs Review Labs Reviewed - No data to  display Imaging Review No results found.  MDM  No diagnosis found. *I have reviewed nursing notes, vital signs, and all appropriate lab and imaging results for this patient.**  Pt refitted with ulnar gutter splint. Pt referred to the ED case manager for assistance with medical financial problem. Rx for percocet # 15 tabs given to the patient.   Kathie Dike, PA-C 03/13/13 1139

## 2013-03-13 NOTE — Progress Notes (Signed)
ED/CM noted patient did not have health insurance and/or PCP listed in the computer.  Patient was given the [facility] with information on the clinics, food pantries, and the handout for new health insurance sign-up.  Patient expressed appreciation for this. Also per Mr Link Snuffer, [place], pt needs to see Dr Romeo Apple and has not had the money to pay for the visit, so has not gone to see him, but has returned to the ED x 3 for splint/ dressing changes and pain management of her Fx hand. Pt states  that she would appreciate any help available. Referred to Lubertha Basque, financial counselor for the [facility] fund application to be mailed to her.
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
## 2013-03-13 NOTE — ED Provider Notes (Signed)
Medical screening examination/treatment/procedure(s) were performed by non-physician practitioner and as supervising physician I was immediately available for consultation/collaboration.   Laurier Jasperson T Demitrius Crass, MD 03/13/13 1549 

## 2013-03-20 NOTE — Telephone Encounter (Signed)
No further reply back from attempts made to reach patient.

## 2014-12-22 ENCOUNTER — Emergency Department (HOSPITAL_COMMUNITY)
Admission: EM | Admit: 2014-12-22 | Discharge: 2014-12-22 | Disposition: A | Discharge status: Home / Self Care | Payer: No Typology Code available for payment source | Attending: Emergency Medicine | Admitting: Emergency Medicine

## 2014-12-22 ENCOUNTER — Emergency Department (HOSPITAL_COMMUNITY): Payer: No Typology Code available for payment source

## 2014-12-22 ENCOUNTER — Encounter (HOSPITAL_COMMUNITY): Payer: Self-pay | Admitting: Emergency Medicine

## 2014-12-22 DIAGNOSIS — S39012A Strain of muscle, fascia and tendon of lower back, initial encounter: Secondary | ICD-10-CM | POA: Diagnosis not present

## 2014-12-22 DIAGNOSIS — Y92410 Unspecified street and highway as the place of occurrence of the external cause: Secondary | ICD-10-CM | POA: Insufficient documentation

## 2014-12-22 DIAGNOSIS — Y9389 Activity, other specified: Secondary | ICD-10-CM | POA: Diagnosis not present

## 2014-12-22 DIAGNOSIS — S3992XA Unspecified injury of lower back, initial encounter: Secondary | ICD-10-CM | POA: Diagnosis present

## 2014-12-22 DIAGNOSIS — Z720 Tobacco use: Secondary | ICD-10-CM | POA: Insufficient documentation

## 2014-12-22 DIAGNOSIS — Y998 Other external cause status: Secondary | ICD-10-CM | POA: Insufficient documentation

## 2014-12-22 MED ORDER — BACLOFEN 10 MG PO TABS: 10.0000 mg | ORAL_TABLET | Freq: Three times a day (TID) | ORAL | Status: AC

## 2014-12-22 MED ORDER — KETOROLAC TROMETHAMINE 10 MG PO TABS
10.0000 mg | ORAL_TABLET | Freq: Once | ORAL | Status: AC
  Administered 2014-12-22: 10 mg via ORAL
  Filled 2014-12-22: qty 1

## 2014-12-22 MED ORDER — DIAZEPAM 5 MG PO TABS
5.0000 mg | ORAL_TABLET | Freq: Once | ORAL | Status: AC
  Administered 2014-12-22: 5 mg via ORAL
  Filled 2014-12-22: qty 1

## 2014-12-22 MED ORDER — DICLOFENAC SODIUM 75 MG PO TBEC: 75.0000 mg | DELAYED_RELEASE_TABLET | Freq: Two times a day (BID) | ORAL | Status: DC
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
## 2014-12-22 NOTE — Discharge Instructions (Signed)
Heating pad to your lower back maybe helpful. Please use diclofenac 2 times daily with food. May use Tylenol Extra Strength in between the diclofenac doses. Use baclofen 3 times daily for spasm pain. This medication may cause drowsiness, please use with caution. Lumbosacral Strain Lumbosacral strain is a strain of any of the parts that make up your lumbosacral vertebrae. Your lumbosacral vertebrae are the bones that make up the lower third of your backbone. Your lumbosacral vertebrae are held together by muscles and tough, fibrous tissue (ligaments).  CAUSES  A sudden blow to your back can cause lumbosacral strain. Also, anything that causes an excessive stretch of the muscles in the low back can cause this strain. This is typically seen when people exert themselves strenuously, fall, lift heavy objects, bend, or crouch repeatedly. RISK FACTORS  Physically demanding work.  Participation in pushing or pulling sports or sports that require a sudden twist of the back (tennis, golf, baseball).  Weight lifting.  Excessive lower back curvature.  Forward-tilted pelvis.  Weak back or abdominal muscles or both.  Tight hamstrings. SIGNS AND SYMPTOMS  Lumbosacral strain may cause pain in the area of your injury or pain that moves (radiates) down your leg.  DIAGNOSIS Your health care provider can often diagnose lumbosacral strain through a physical exam. In some cases, you may need tests such as X-ray exams.  TREATMENT  Treatment for your lower back injury depends on many factors that your clinician will have to evaluate. However, most treatment will include the use of anti-inflammatory medicines. HOME CARE INSTRUCTIONS   Avoid hard physical activities (tennis, racquetball, waterskiing) if you are not in proper physical condition for it. This may aggravate or create problems.  If you have a back problem, avoid sports requiring sudden body movements. Swimming and walking are generally safer  activities.  Maintain good posture.  Maintain a healthy weight.  For acute conditions, you may put ice on the injured area.  Put ice in a plastic bag.  Place a towel between your skin and the bag.  Leave the ice on for 20 minutes, 2-3 times a day.  When the low back starts healing, stretching and strengthening exercises may be recommended. SEEK MEDICAL CARE IF:  Your back pain is getting worse.  You experience severe back pain not relieved with medicines. SEEK IMMEDIATE MEDICAL CARE IF:   You have numbness, tingling, weakness, or problems with the use of your arms or legs.  There is a change in bowel or bladder control.  You have increasing pain in any area of the body, including your belly (abdomen).  You notice shortness of breath, dizziness, or feel faint.  You feel sick to your stomach (nauseous), are throwing up (vomiting), or become sweaty.  You notice discoloration of your toes or legs, or your feet get very cold. MAKE SURE YOU:   Understand these instructions.  Will watch your condition.  Will get help right away if you are not doing well or get worse. Document Released: 03/03/2005 Document Revised: 05/29/2013 Document Reviewed: 01/10/2013 [facility] LPExitCare Patient Information 2015 [place], [place]. This information is not intended to replace advice given to you by your health care provider. Make sure you discuss any questions you have with your health care provider.

## 2014-12-22 NOTE — ED Notes (Signed)
Verbal order from Ivery QualeHobson Bryant, EDPa to cancel xray.

## 2014-12-23 ENCOUNTER — Emergency Department (HOSPITAL_COMMUNITY): Payer: No Typology Code available for payment source

## 2014-12-23 ENCOUNTER — Emergency Department (HOSPITAL_COMMUNITY)
Admission: EM | Admit: 2014-12-23 | Discharge: 2014-12-23 | Disposition: A | Discharge status: Home / Self Care | Payer: No Typology Code available for payment source | Attending: Emergency Medicine | Admitting: Emergency Medicine

## 2014-12-23 DIAGNOSIS — Z791 Long term (current) use of non-steroidal anti-inflammatories (NSAID): Secondary | ICD-10-CM | POA: Diagnosis not present

## 2014-12-23 DIAGNOSIS — Z720 Tobacco use: Secondary | ICD-10-CM | POA: Insufficient documentation

## 2014-12-23 DIAGNOSIS — S39012D Strain of muscle, fascia and tendon of lower back, subsequent encounter: Secondary | ICD-10-CM | POA: Diagnosis not present

## 2014-12-23 DIAGNOSIS — S3992XD Unspecified injury of lower back, subsequent encounter: Secondary | ICD-10-CM | POA: Diagnosis present

## 2014-12-23 MED ORDER — HYDROCODONE-ACETAMINOPHEN 5-325 MG PO TABS
ORAL_TABLET | ORAL | Status: DC
Start: 2014-12-23 — End: 2021-09-14

## 2014-12-23 MED ORDER — OXYCODONE-ACETAMINOPHEN 5-325 MG PO TABS
1.0000 | ORAL_TABLET | Freq: Once | ORAL | Status: AC
  Administered 2014-12-23: 1 via ORAL
  Filled 2014-12-23: qty 1
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
## 2014-12-23 NOTE — Discharge Instructions (Signed)
Back Pain, Adult °Back pain is very common. The pain often gets better over time. The cause of back pain is usually not dangerous. Most people can learn to manage their back pain on their own.  °HOME CARE  °· Stay active. Start with short walks on flat ground if you can. Try to walk farther each day. °· Do not sit, drive, or stand in one place for more than 30 minutes. Do not stay in bed. °· Do not avoid exercise or work. Activity can help your back heal faster. °· Be careful when you bend or lift an object. Bend at your knees, keep the object close to you, and do not twist. °· Sleep on a firm mattress. Lie on your side, and bend your knees. If you lie on your back, put a pillow under your knees. °· Only take medicines as told by your doctor. °· Put ice on the injured area. °¨ Put ice in a plastic bag. °¨ Place a towel between your skin and the bag. °¨ Leave the ice on for 15-20 minutes, 03-04 times a day for the first 2 to 3 days. After that, you can switch between ice and heat packs. °· Ask your doctor about back exercises or massage. °· Avoid feeling anxious or stressed. Find good ways to deal with stress, such as exercise. °GET HELP RIGHT AWAY IF:  °· Your pain does not go away with rest or medicine. °· Your pain does not go away in 1 week. °· You have new problems. °· You do not feel well. °· The pain spreads into your legs. °· You cannot control when you poop (bowel movement) or pee (urinate). °· Your arms or legs feel weak or lose feeling (numbness). °· You feel sick to your stomach (nauseous) or throw up (vomit). °· You have belly (abdominal) pain. °· You feel like you may pass out (faint). °MAKE SURE YOU:  °· Understand these instructions. °· Will watch your condition. °· Will get help right away if you are not doing well or get worse. °Document Released: 11/10/2007 Document Revised: 08/16/2011 Document Reviewed: 09/25/2013 °ExitCare® Patient Information ©2015 [employer], LLC. This information is not intended  to replace advice given to you by your health care provider. Make sure you discuss any questions you have with your health care provider. ° °
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
## 2015-04-01 ENCOUNTER — Encounter (HOSPITAL_COMMUNITY): Payer: Self-pay

## 2015-04-01 ENCOUNTER — Emergency Department (HOSPITAL_COMMUNITY): Payer: Self-pay

## 2015-04-01 ENCOUNTER — Emergency Department (HOSPITAL_COMMUNITY)
Admission: EM | Admit: 2015-04-01 | Discharge: 2015-04-01 | Disposition: A | Discharge status: Home / Self Care | Payer: Self-pay | Attending: Emergency Medicine | Admitting: Emergency Medicine

## 2015-04-01 DIAGNOSIS — Z791 Long term (current) use of non-steroidal anti-inflammatories (NSAID): Secondary | ICD-10-CM | POA: Insufficient documentation

## 2015-04-01 DIAGNOSIS — W228XXA Striking against or struck by other objects, initial encounter: Secondary | ICD-10-CM | POA: Insufficient documentation

## 2015-04-01 DIAGNOSIS — Y9389 Activity, other specified: Secondary | ICD-10-CM | POA: Insufficient documentation

## 2015-04-01 DIAGNOSIS — Y998 Other external cause status: Secondary | ICD-10-CM | POA: Insufficient documentation

## 2015-04-01 DIAGNOSIS — Z720 Tobacco use: Secondary | ICD-10-CM | POA: Insufficient documentation

## 2015-04-01 DIAGNOSIS — S5001XA Contusion of right elbow, initial encounter: Secondary | ICD-10-CM | POA: Insufficient documentation

## 2015-04-01 DIAGNOSIS — Y9289 Other specified places as the place of occurrence of the external cause: Secondary | ICD-10-CM | POA: Insufficient documentation
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
## 2015-04-01 NOTE — ED Provider Notes (Signed)
CSN: [id number]     Arrival date & time 04/01/15  [id number] History   First MD Initiated Contact with Patient 04/01/15 [phone]     Chief Complaint  Patient presents with  . Elbow Pain      HPI  Patient presents for evaluation of right elbow pain. Bumped her elbow while moving a stove with her brother 2 days ago. Struck the tip of her olecranon against a door jamb complains of pain and swelling.  History reviewed. No pertinent past medical history. Past Surgical History  Procedure Laterality Date  . Cesarean section     No family history on file. Social History  Substance Use Topics  . Smoking status: Current Every Day Smoker -- 0.50 packs/day    Types: Cigarettes  . Smokeless tobacco: None  . Alcohol Use: Yes     Comment: occ   OB History    No data available     Review of Systems  Constitutional: Negative for fever, chills, diaphoresis, appetite change and fatigue.  HENT: Negative for mouth sores, sore throat and trouble swallowing.   Eyes: Negative for visual disturbance.  Respiratory: Negative for cough, chest tightness, shortness of breath and wheezing.   Cardiovascular: Negative for chest pain.  Gastrointestinal: Negative for nausea, vomiting, abdominal pain, diarrhea and abdominal distention.  Endocrine: Negative for polydipsia, polyphagia and polyuria.  Genitourinary: Negative for dysuria, frequency and hematuria.  Musculoskeletal: Negative for gait problem.       Pain to the tip of the right elbow.  Skin: Negative for color change, pallor and rash.  Neurological: Negative for dizziness, syncope, light-headedness and headaches.  Hematological: Does not bruise/bleed easily.  Psychiatric/Behavioral: Negative for behavioral problems and confusion.      Allergies  Review of patient's allergies indicates no known allergies.  Home Medications   Prior to Admission medications   Medication Sig Start Date End Date Taking? Authorizing Provider  diclofenac (VOLTAREN) 75  MG EC tablet Take 1 tablet (75 mg total) by mouth 2 (two) times daily. 12/22/14   Ivery QualeHobson Bryant, PA-C  HYDROcodone-acetaminophen (NORCO/VICODIN) 5-325 MG per tablet Take one-two tabs po q 4-6 hrs prn pain 12/23/14   Tammy Triplett, PA-C  naproxen sodium (ALEVE) 220 MG tablet Take 220 mg by mouth daily as needed. pain    Historical Provider, MD   BP 170/106 mmHg  Pulse 71  Temp(Src) 98.2 F (36.8 C) (Oral)  Resp 20  Ht 5\' 6"  (1.676 m)  Wt 180 lb (81.647 kg)  BMI 29.07 kg/m2  SpO2 100%  LMP 03/23/2015 Physical Exam  Musculoskeletal:       Arms:   ED Course  Procedures (including critical care time) Labs Review Labs Reviewed - No data to display  Imaging Review Dg Elbow Complete Right  04/01/2015  CLINICAL DATA:  Hit right elbow on something 2 days ago with pain and swelling at the olecranon area. EXAM: RIGHT ELBOW - COMPLETE 3+ VIEW COMPARISON:  None. FINDINGS: There is no evidence of fracture, dislocation, or joint effusion. Minimal spurring to the radial head and coronoid process. No opaque foreign body or soft tissue gas. IMPRESSION: No acute finding. Electronically Signed   By: Marnee SpringJonathon  Watts M.D.   On: 04/01/2015 09:36   I have personally reviewed and evaluated these images and lab results as part of my medical decision-making.   EKG Interpretation None      MDM   Final diagnoses:  Elbow contusion, right, initial encounter    Contusion. Normal x-ray. No clinical  bursitis. Simple tendon sheath cyst on her right arm that does not appear acute. Plan is symptomatically treatment. Ace wrap placed. Ice, motrin, and Tylenol.    Rolland Porter, MD 04/01/15 [phone]

## 2015-04-01 NOTE — Discharge Instructions (Signed)
Ace wrap to pad and protect.  Ice pack and Motrin as needed.   Contusion A contusion is a deep bruise. Contusions are the result of a blunt injury to tissues and muscle fibers under the skin. The injury causes bleeding under the skin. The skin overlying the contusion may turn blue, purple, or yellow. Minor injuries will give you a painless contusion, but more severe contusions may stay painful and swollen for a few weeks.  CAUSES  This condition is usually caused by a blow, trauma, or direct force to an area of the body. SYMPTOMS  Symptoms of this condition include:  Swelling of the injured area.  Pain and tenderness in the injured area.  Discoloration. The area may have redness and then turn blue, purple, or yellow. DIAGNOSIS  This condition is diagnosed based on a physical exam and medical history. An X-ray, CT scan, or MRI may be needed to determine if there are any associated injuries, such as broken bones (fractures). TREATMENT  Specific treatment for this condition depends on what area of the body was injured. In general, the best treatment for a contusion is resting, icing, applying pressure to (compression), and elevating the injured area. This is often called the RICE strategy. Over-the-counter anti-inflammatory medicines may also be recommended for pain control.  HOME CARE INSTRUCTIONS   Rest the injured area.  If directed, apply ice to the injured area:  Put ice in a plastic bag.  Place a towel between your skin and the bag.  Leave the ice on for 20 minutes, 2-3 times per day.  If directed, apply light compression to the injured area using an elastic bandage. Make sure the bandage is not wrapped too tightly. Remove and reapply the bandage as directed by your health care provider.  If possible, raise (elevate) the injured area above the level of your heart while you are sitting or lying down.  Take over-the-counter and prescription medicines only as told by your health  care provider. SEEK MEDICAL CARE IF:  Your symptoms do not improve after several days of treatment.  Your symptoms get worse.  You have difficulty moving the injured area. SEEK IMMEDIATE MEDICAL CARE IF:   You have severe pain.  You have numbness in a hand or foot.  Your hand or foot turns pale or cold.   This information is not intended to replace advice given to you by your health care provider. Make sure you discuss any questions you have with your health care provider.   Document Released: 03/03/2005 Document Revised: 02/12/2015 Document Reviewed: 10/09/2014 Elsevier Interactive Patient Education [employer]2016 [employer] Inc.

## 2015-05-20 ENCOUNTER — Other Ambulatory Visit (HOSPITAL_COMMUNITY): Payer: Self-pay | Admitting: Nurse Practitioner

## 2015-05-20 DIAGNOSIS — Z1231 Encounter for screening mammogram for malignant neoplasm of breast: Secondary | ICD-10-CM

## 2015-05-26 ENCOUNTER — Ambulatory Visit (HOSPITAL_COMMUNITY)
Admission: RE | Admit: 2015-05-26 | Discharge: 2015-05-26 | Disposition: A | Discharge status: Home / Self Care | Payer: Self-pay | Source: Ambulatory Visit | Attending: Nurse Practitioner | Admitting: Nurse Practitioner

## 2015-05-26 DIAGNOSIS — Z1231 Encounter for screening mammogram for malignant neoplasm of breast: Secondary | ICD-10-CM | POA: Insufficient documentation

## 2015-06-20 ENCOUNTER — Other Ambulatory Visit (HOSPITAL_COMMUNITY): Payer: Self-pay | Admitting: Nurse Practitioner

## 2015-06-20 ENCOUNTER — Ambulatory Visit (HOSPITAL_COMMUNITY)
Admission: RE | Admit: 2015-06-20 | Discharge: 2015-06-20 | Disposition: A | Discharge status: Home / Self Care | Payer: No Typology Code available for payment source | Source: Ambulatory Visit | Attending: Nurse Practitioner | Admitting: Nurse Practitioner

## 2015-06-20 ENCOUNTER — Ambulatory Visit (HOSPITAL_COMMUNITY): Admission: RE | Admit: 2015-06-20 | Payer: Self-pay | Source: Ambulatory Visit

## 2015-06-20 DIAGNOSIS — Z1231 Encounter for screening mammogram for malignant neoplasm of breast: Secondary | ICD-10-CM | POA: Insufficient documentation

## 2016-07-01 ENCOUNTER — Other Ambulatory Visit (HOSPITAL_COMMUNITY): Payer: Self-pay | Admitting: Nurse Practitioner

## 2016-07-01 DIAGNOSIS — Z1231 Encounter for screening mammogram for malignant neoplasm of breast: Secondary | ICD-10-CM

## 2016-07-09 ENCOUNTER — Ambulatory Visit (HOSPITAL_COMMUNITY)
Admission: RE | Admit: 2016-07-09 | Discharge: 2016-07-09 | Disposition: A | Discharge status: Home / Self Care | Payer: Self-pay | Source: Ambulatory Visit | Attending: Nurse Practitioner | Admitting: Nurse Practitioner

## 2016-07-09 ENCOUNTER — Other Ambulatory Visit (HOSPITAL_COMMUNITY): Payer: Self-pay | Admitting: Nurse Practitioner

## 2016-07-09 DIAGNOSIS — Z1231 Encounter for screening mammogram for malignant neoplasm of breast: Secondary | ICD-10-CM | POA: Insufficient documentation

## 2017-09-02 ENCOUNTER — Other Ambulatory Visit: Payer: Self-pay | Admitting: Nurse Practitioner

## 2017-09-02 DIAGNOSIS — Z1231 Encounter for screening mammogram for malignant neoplasm of breast: Secondary | ICD-10-CM

## 2017-10-07 ENCOUNTER — Emergency Department (HOSPITAL_COMMUNITY)
Admission: EM | Admit: 2017-10-07 | Discharge: 2017-10-07 | Disposition: A | Discharge status: Home / Self Care | Payer: Self-pay | Attending: Emergency Medicine | Admitting: Emergency Medicine

## 2017-10-07 ENCOUNTER — Encounter (HOSPITAL_COMMUNITY): Payer: Self-pay | Admitting: Emergency Medicine

## 2017-10-07 ENCOUNTER — Emergency Department (HOSPITAL_COMMUNITY): Payer: Self-pay

## 2017-10-07 ENCOUNTER — Other Ambulatory Visit: Payer: Self-pay

## 2017-10-07 DIAGNOSIS — S63612A Unspecified sprain of right middle finger, initial encounter: Secondary | ICD-10-CM | POA: Insufficient documentation

## 2017-10-07 DIAGNOSIS — W230XXA Caught, crushed, jammed, or pinched between moving objects, initial encounter: Secondary | ICD-10-CM | POA: Insufficient documentation

## 2017-10-07 DIAGNOSIS — I10 Essential (primary) hypertension: Secondary | ICD-10-CM | POA: Insufficient documentation

## 2017-10-07 DIAGNOSIS — F17210 Nicotine dependence, cigarettes, uncomplicated: Secondary | ICD-10-CM | POA: Insufficient documentation

## 2017-10-07 DIAGNOSIS — Y998 Other external cause status: Secondary | ICD-10-CM | POA: Insufficient documentation

## 2017-10-07 DIAGNOSIS — Y939 Activity, unspecified: Secondary | ICD-10-CM | POA: Insufficient documentation

## 2017-10-07 DIAGNOSIS — Y929 Unspecified place or not applicable: Secondary | ICD-10-CM | POA: Insufficient documentation

## 2017-10-07 MED ORDER — HYDROCHLOROTHIAZIDE 25 MG PO TABS: 25.0000 mg | ORAL_TABLET | Freq: Every day | ORAL | 0 refills | Status: DC

## 2017-10-07 NOTE — Discharge Instructions (Addendum)
Elevate and apply ice packs on and off to your finger wear the splint for 1 week you may remove it to bathe.  Your blood pressure today is elevated, I am starting you on a blood pressure medication, call your provider at the health department to arrange a recheck of your blood pressure in 1 to 2 weeks.  You may take tylenol if needed for pain.  Return to the ER for any worsening symptoms.

## 2017-10-07 NOTE — ED Provider Notes (Signed)
[facility] EMERGENCY DEPARTMENT Provider Note   CSN: [id number] Arrival date & time: 10/07/17  0913     History   Chief Complaint Chief Complaint  Patient presents with  . Hand Pain    HPI Erika Kelley is a 47 y.o. female.  HPI   Erika Kelley is a 47 y.o. female who presents to the Emergency Department complaining of throbbing pain to her right middle finger for 2 days.  She states the pain began after she accidentally shut a car door onto her finger.  She describes pain as constant and worse with movement of her finger.  Pain intermittently radiates into her forearm.  She has not taken any medication or tried any therapies prior to ER arrival.  No open wound.  She denies numbness of her finger or hand, wrist pain, swelling or nail injury.  Patient is right-hand dominant.   History reviewed. No pertinent past medical history.  There are no active problems to display for this patient.   Past Surgical History:  Procedure Laterality Date  . CESAREAN SECTION       OB History   None      Home Medications    Prior to Admission medications   Medication Sig Start Date End Date Taking? Authorizing Provider  diclofenac (VOLTAREN) 75 MG EC tablet Take 1 tablet (75 mg total) by mouth 2 (two) times daily. 12/22/14   Ivery Quale, PA-C  HYDROcodone-acetaminophen (NORCO/VICODIN) 5-325 MG per tablet Take one-two tabs po q 4-6 hrs prn pain 12/23/14   Jahniah Pallas, PA-C  naproxen sodium (ALEVE) 220 MG tablet Take 220 mg by mouth daily as needed. pain    [provider]    Family History History reviewed. No pertinent family history.  Social History Social History   Tobacco Use  . Smoking status: Current Every Day Smoker    Packs/day: 0.50    Types: Cigarettes  . Smokeless tobacco: Never Used  Substance Use Topics  . Alcohol use: Yes    Comment: occ  . Drug use: No     Allergies   Patient has no known allergies.   Review of Systems Review  of Systems  Constitutional: Negative for chills and fever.  Musculoskeletal: Positive for arthralgias (Right middle finger pain). Negative for joint swelling.  Skin: Negative for color change and wound.  Neurological: Negative for weakness and numbness.  All other systems reviewed and are negative.    Physical Exam Updated Vital Signs BP (S) (!) 169/111 (BP Location: Left Arm)   Pulse 85   Temp 98.5 F (36.9 C)   Resp 18   Ht  (1.676 m)   Wt 79.8 kg (176 lb)   SpO2 99%   BMI 28.41 kg/m   Physical Exam  Constitutional: She is oriented to person, place, and time. She appears well-developed and well-nourished. No distress.  HENT:  Head: Atraumatic.  Cardiovascular: Normal rate, regular rhythm and intact distal pulses.  Pulmonary/Chest: Effort normal and breath sounds normal.  Musculoskeletal: She exhibits tenderness. She exhibits no edema or deformity.  Focal tenderness to palpation of the PIP joint and middle phalanx of the right middle finger.  No ecchymosis or edema.  Distal tenderness or injury of the nail.  No open wound.  Right wrist is nontender.  Neurological: She is alert and oriented to person, place, and time. She exhibits normal muscle tone. Coordination normal.  Skin: Skin is warm and dry. Capillary refill takes less than 2 seconds.  Nursing note and vitals reviewed.    ED Treatments / Results  Labs (all labs ordered are listed, but only abnormal results are displayed) Labs Reviewed - No data to display  EKG None  Radiology Dg Finger Middle Right  Result Date: 10/07/2017 CLINICAL DATA:  Slammed finger in car door EXAM: RIGHT THIRD FINGER 2+V COMPARISON:  None. FINDINGS: Frontal, oblique, and lateral views were obtained. No evident fracture or dislocation. Joint spaces appear normal. No erosive change. No radiopaque body. IMPRESSION: No fracture or dislocation.  No evident arthropathy. Electronically Signed   By: Bretta Bang III M.D.   On: 10/07/2017  09:44    Procedures Procedures (including critical care time)  SPLINT APPLICATION Date/Time: 10:11 AM Authorized by: Maxwell Caul. Consent: Verbal consent obtained. Risks and benefits: risks, benefits and alternatives were discussed Consent given by: patient Splint applied by: nurse Location details: finger splint Splint type: finger splint Supplies used: metal finger splint Post-procedure: The splinted body part was neurovascularly unchanged following the procedure. Patient tolerance: Patient tolerated the procedure well with no immediate complications.   Medications Ordered in ED Medications - No data to display   Initial Impression / Assessment and Plan / ED Course  I have reviewed the triage vital signs and the nursing notes.  Pertinent labs & imaging results that were available during my care of the patient were reviewed by me and considered in my medical decision making (see chart for details).     Patient with likely contusion of the right middle finger.  Neurovascularly intact.  X-ray reassuring, negative for fracture or dislocation.  Patient agrees to symptomatic treatment with elevation, ice, and splint applied for comfort.  Referral information given for orthopedics.  Recheck of vitals improved, but BP remains elevated.  familial hx of HTN.  I will start pt on HCTZ and she agrees to establish care with [facility].  In 2 weeks.    Final Clinical Impressions(s) / ED Diagnoses   Final diagnoses:  Sprain of right middle finger, unspecified site of finger, initial encounter  Essential hypertension    ED Discharge Orders    None       Pauline Aus, PA-C 10/08/17 0002    Donnetta Hutching, MD 10/08/17 [phone]

## 2017-10-07 NOTE — ED Triage Notes (Signed)
Patient c/o right middle finger pain that radiates into hand. Per patient accidentally shut finger in car door 2 days ago. Denies taking anything for pain.

## 2018-11-03 ENCOUNTER — Other Ambulatory Visit (HOSPITAL_COMMUNITY): Payer: Self-pay | Admitting: Nurse Practitioner

## 2018-11-03 DIAGNOSIS — Z1231 Encounter for screening mammogram for malignant neoplasm of breast: Secondary | ICD-10-CM

## 2018-11-20 ENCOUNTER — Ambulatory Visit (HOSPITAL_COMMUNITY)
Admission: RE | Admit: 2018-11-20 | Discharge: 2018-11-20 | Disposition: A | Discharge status: Home / Self Care | Payer: Self-pay | Source: Ambulatory Visit | Attending: Nurse Practitioner | Admitting: Nurse Practitioner

## 2018-11-20 ENCOUNTER — Other Ambulatory Visit: Payer: Self-pay

## 2018-11-20 DIAGNOSIS — Z1231 Encounter for screening mammogram for malignant neoplasm of breast: Secondary | ICD-10-CM | POA: Insufficient documentation

## 2019-01-11 ENCOUNTER — Other Ambulatory Visit: Payer: Self-pay

## 2019-01-11 DIAGNOSIS — Z20822 Contact with and (suspected) exposure to covid-19: Secondary | ICD-10-CM

## 2019-01-13 LAB — SPECIMEN STATUS REPORT

## 2019-01-13 LAB — NOVEL CORONAVIRUS, NAA: SARS-CoV-2, NAA: NOT DETECTED

## 2019-10-25 ENCOUNTER — Other Ambulatory Visit: Payer: Self-pay

## 2019-10-25 ENCOUNTER — Encounter (HOSPITAL_COMMUNITY): Payer: Self-pay | Admitting: *Deleted

## 2019-10-25 ENCOUNTER — Emergency Department (HOSPITAL_COMMUNITY): Payer: Self-pay

## 2019-10-25 ENCOUNTER — Emergency Department (HOSPITAL_COMMUNITY)
Admission: EM | Admit: 2019-10-25 | Discharge: 2019-10-25 | Disposition: A | Discharge status: Home / Self Care | Payer: Self-pay | Attending: Emergency Medicine | Admitting: Emergency Medicine

## 2019-10-25 DIAGNOSIS — Z79899 Other long term (current) drug therapy: Secondary | ICD-10-CM | POA: Insufficient documentation

## 2019-10-25 DIAGNOSIS — M546 Pain in thoracic spine: Secondary | ICD-10-CM | POA: Insufficient documentation

## 2019-10-25 DIAGNOSIS — S22000A Wedge compression fracture of unspecified thoracic vertebra, initial encounter for closed fracture: Secondary | ICD-10-CM

## 2019-10-25 DIAGNOSIS — R109 Unspecified abdominal pain: Secondary | ICD-10-CM | POA: Insufficient documentation

## 2019-10-25 DIAGNOSIS — F17210 Nicotine dependence, cigarettes, uncomplicated: Secondary | ICD-10-CM | POA: Insufficient documentation

## 2019-10-25 LAB — BASIC METABOLIC PANEL
Anion gap: 13 (ref 5–15)
BUN: 5 mg/dL — ABNORMAL LOW (ref 6–20)
CO2: 25 mmol/L (ref 22–32)
Calcium: 8.5 mg/dL — ABNORMAL LOW (ref 8.9–10.3)
Chloride: 101 mmol/L (ref 98–111)
Creatinine, Ser: 0.59 mg/dL (ref 0.44–1.00)
GFR calc Af Amer: >60 mL/min (ref >60)
GFR calc non Af Amer: >60 mL/min (ref >60)
Glucose, Bld: 74 mg/dL (ref 70–99)
Potassium: 3.2 mmol/L — ABNORMAL LOW (ref 3.5–5.1)
Sodium: 139 mmol/L (ref 135–145)

## 2019-10-25 LAB — CBC WITH DIFFERENTIAL/PLATELET
Abs Immature Granulocytes: 0.03 10*3/uL (ref 0.00–0.07)
Basophils Absolute: 0.1 10*3/uL (ref 0.0–0.1)
Basophils Relative: 1 %
Eosinophils Absolute: 0.1 10*3/uL (ref 0.0–0.5)
Eosinophils Relative: 1 %
HCT: 42.3 % (ref 36.0–46.0)
Hemoglobin: 13.6 g/dL (ref 12.0–15.0)
Immature Granulocytes: 1 %
Lymphocytes Relative: 32 %
Lymphs Abs: 1.6 10*3/uL (ref 0.7–4.0)
MCH: 27.6 pg (ref 26.0–34.0)
MCHC: 32.2 g/dL (ref 30.0–36.0)
MCV: 86 fL (ref 80.0–100.0)
Monocytes Absolute: 0.5 10*3/uL (ref 0.1–1.0)
Monocytes Relative: 9 %
Neutro Abs: 2.7 10*3/uL (ref 1.7–7.7)
Neutrophils Relative %: 56 %
Platelets: 245 10*3/uL (ref 150–400)
RBC: 4.92 MIL/uL (ref 3.87–5.11)
RDW: 13.4 % (ref 11.5–15.5)
WBC: 4.9 10*3/uL (ref 4.0–10.5)
nRBC: 0 % (ref 0.0–0.2)

## 2019-10-25 MED ORDER — OXYCODONE-ACETAMINOPHEN 5-325 MG PO TABS
2.0000 | ORAL_TABLET | Freq: Once | ORAL | Status: AC
  Administered 2019-10-25: 2 via ORAL
  Filled 2019-10-25: qty 2

## 2019-10-25 MED ORDER — OXYCODONE-ACETAMINOPHEN 5-325 MG PO TABS: 1.0000 | ORAL_TABLET | ORAL | 0 refills | Status: DC | PRN

## 2019-10-25 NOTE — ED Triage Notes (Signed)
Pt with mid back pain since picking up her god daughter, pt states pain has radiated to mid of her abdomen now.

## 2019-10-25 NOTE — ED Notes (Signed)
Pt placed on 4 L [place]  

## 2019-10-25 NOTE — ED Provider Notes (Signed)
[facility] EMERGENCY DEPARTMENT Provider Note   CSN: [id number] Arrival date & time: 10/25/19  2016     History Chief Complaint  Patient presents with  . Back Pain    Erika Kelley is a 49 y.o. female.  HPI She presents for evaluation of pain in her back and abdomen that started today after her goddaughter was sitting on her shoulders, and she felt a "pop," in the middle of her back.  She has mid back pain that radiates to the mid abdomen.  She is able to walk.  No prior similar problems.  She describes the pain is severe.  No recent illnesses.  She is a cigarette smoker.  There are no other known modifying factors.    History reviewed. No pertinent past medical history.  There are no problems to display for this patient.   Past Surgical History:  Procedure Laterality Date  . CESAREAN SECTION       OB History   No obstetric history on file.     History reviewed. No pertinent family history.  Social History   Tobacco Use  . Smoking status: Current Every Day Smoker    Packs/day: 0.50    Types: Cigarettes  . Smokeless tobacco: Never Used  Substance Use Topics  . Alcohol use: Yes    Comment: occ  . Drug use: No    Home Medications Prior to Admission medications   Medication Sig Start Date End Date Taking? Authorizing Provider  diclofenac (VOLTAREN) 75 MG EC tablet Take 1 tablet (75 mg total) by mouth 2 (two) times daily. 12/22/14   Ivery Quale, PA-C  hydrochlorothiazide (HYDRODIURIL) 25 MG tablet Take 1 tablet (25 mg total) by mouth daily. 10/07/17   Triplett, Tammy, PA-C  HYDROcodone-acetaminophen (NORCO/VICODIN) 5-325 MG per tablet Take one-two tabs po q 4-6 hrs prn pain 12/23/14   Triplett, Tammy, PA-C  naproxen sodium (ALEVE) 220 MG tablet Take 220 mg by mouth daily as needed. pain    [provider]  oxyCODONE-acetaminophen (PERCOCET) 5-325 MG tablet Take 1 tablet by mouth every 4 (four) hours as needed for moderate pain or severe pain. 10/25/19    Mancel Bale, MD    Allergies    Patient has no known allergies.  Review of Systems   Review of Systems  All other systems reviewed and are negative.   Physical Exam Updated Vital Signs BP (!) 153/109 (BP Location: Left Arm)   Pulse 90   Temp 98.6 F (37 C) (Temporal)   Resp 18   Ht 5\' 6"  (1.676 m)   Wt 80.7 kg   SpO2 90%   BMI 28.73 kg/m   Physical Exam Vitals and nursing note reviewed.  Constitutional:      General: She is in acute distress (Uncomfortable, moaning).     Appearance: She is well-developed. She is not ill-appearing, toxic-appearing or diaphoretic.  HENT:     Head: Normocephalic and atraumatic.     Right Ear: External ear normal.     Left Ear: External ear normal.  Eyes:     Conjunctiva/sclera: Conjunctivae normal.     Pupils: Pupils are equal, round, and reactive to light.  Neck:     Trachea: Phonation normal.  Cardiovascular:     Rate and Rhythm: Normal rate.  Pulmonary:     Effort: Pulmonary effort is normal. No respiratory distress.     Breath sounds: No stridor.  Abdominal:     General: There is no distension.  Palpations: Abdomen is soft.     Tenderness: There is no abdominal tenderness.  Musculoskeletal:     Cervical back: Normal range of motion and neck supple.     Comments: Guards against movement of the lower thoracic/upper lumbar region.  Skin:    General: Skin is warm and dry.  Neurological:     Mental Status: She is alert and oriented to person, place, and time.     Cranial Nerves: No cranial nerve deficit.     Sensory: No sensory deficit.     Motor: No abnormal muscle tone.     Coordination: Coordination normal.  Psychiatric:        Mood and Affect: Mood normal.        Behavior: Behavior normal.        Thought Content: Thought content normal.        Judgment: Judgment normal.     ED Results / Procedures / Treatments   Labs (all labs ordered are listed, but only abnormal results are displayed) Labs Reviewed  BASIC  METABOLIC PANEL - Abnormal; Notable for the following components:      Result Value   Potassium 3.2 (*)    BUN 5 (*)    Calcium 8.5 (*)    All other components within normal limits  CBC WITH DIFFERENTIAL/PLATELET    EKG None  Radiology DG Thoracic Spine 2 View  Result Date: 10/25/2019 CLINICAL DATA:  Pain. EXAM: THORACIC SPINE 2 VIEWS COMPARISON:  None. FINDINGS: There is age-indeterminate mild height loss of the T6 and T7 vertebral bodies. This is new since 2013. There is no significant malalignment. There are minimal degenerative changes of the thoracic spine. IMPRESSION: 1. Age-indeterminate mild height loss of the T6 and T7 vertebral bodies. Correlation with point tenderness is recommended. 2. Minimal degenerative changes of the thoracic spine. Electronically Signed   By: Katherine Mantle M.D.   On: 10/25/2019 23:01   DG Lumbar Spine 2-3 Views  Result Date: 10/25/2019 CLINICAL DATA:  Pain EXAM: LUMBAR SPINE - 2-3 VIEW COMPARISON:  None. FINDINGS: There is no evidence of lumbar spine fracture. Alignment is normal. Intervertebral disc spaces are maintained. IMPRESSION: Negative. Electronically Signed   By: Katherine Mantle M.D.   On: 10/25/2019 23:01   DG Chest Portable 1 View  Result Date: 10/25/2019 CLINICAL DATA:  Shortness. EXAM: PORTABLE CHEST 1 VIEW COMPARISON:  03/30/2012 FINDINGS: There are streaky bibasilar airspace opacities. There is no pneumothorax. No large pleural effusion. The heart size is mildly enlarged. There is no acute osseous abnormality. There are probable emphysematous changes at the lung apices. IMPRESSION: 1. Streaky bibasilar airspace opacities may represent atelectasis or infiltrate. 2. Mild cardiomegaly. Electronically Signed   By: Katherine Mantle M.D.   On: 10/25/2019 21:35    Procedures Procedures (including critical care time)  Medications Ordered in ED Medications  oxyCODONE-acetaminophen (PERCOCET/ROXICET) 5-325 MG per tablet 2 tablet (2  tablets Oral Given 10/25/19 2228)    ED Course  I have reviewed the triage vital signs and the nursing notes.  Pertinent labs & imaging results that were available during my care of the patient were reviewed by me and considered in my medical decision making (see chart for details).  Clinical Course as of Oct 25 2338  Thu Oct 25, 2019  2311 Normal except potassium low  Basic metabolic panel(!) [EW]  2312 Normal  CBC with Differential [EW]  2312 No acute injury  DG Lumbar Spine 2-3 Views [EW]  2312 Compression T6 and  T7 vertebral bodies  DG Thoracic Spine 2 View [EW]  2312 No infiltrate or edema, interpreted by me  DG Chest Portable 1 View [EW]    Clinical Course User Index [EW] Daleen Bo, MD   MDM Rules/Calculators/A&P                       Patient Vitals for the past 24 hrs:  BP Temp Temp src Pulse Resp SpO2 Height Weight  10/25/19 2318 -- -- -- 90 18 90 % -- --  10/25/19 2230 -- -- -- (!) 113 (!) 28 95 % -- --  10/25/19 2130 -- -- -- (!) 102 15 94 % -- --  10/25/19 2100 -- -- -- 97 (!) 21 91 % -- --  10/25/19 2028 (!) 153/109 98.6 F (37 C) Temporal 98 16 (!) 89 % -- --  10/25/19 2025 -- -- -- -- -- -- 5\' 6"  (1.676 m) 80.7 kg    11:17 PM Reevaluation with update and discussion. After initial assessment and treatment, an updated evaluation reveals she reports the pain is better.  On reassessment I asked her if her pain was in the region of her T6 and 7 vertebrae's, by touching and pointing to this area and she affirmed that this was a site of pain.  Findings discussed and questions answered. Daleen Bo   Medical Decision Making:  This patient is presenting for evaluation of back pain following axial loading injury., which does require a range of treatment options, and is a complaint that involves a moderate risk of morbidity and mortality. The differential diagnoses include fracture, muscle strain. I decided to review old records, and in summary previously healthy  female who is a smoker, presenting with back pain which is new.  I did not require additional historical information from anyone.   Radiologic Tests Ordered, included plain images of thoracic and lumbar spine.  I independently Visualized: Radiographic images, which show T6 and T7 vertebral body compressions, age-indeterminate  Critical Interventions-clinical evaluation, laboratory testing, radiographic imaging, observation reassessment after treatment with Percocet  After These Interventions, the Patient was reevaluated and was found improved pain.  Mechanism of injury, radiographic findings and site of pain is consistent with acute vertebral body compression fractures, T6 and T7.  She is neurologically intact.  This does not appear to be an unstable spine injury.  At this point symptomatic care and discharge is indicated.  CRITICAL CARE-no Performed by: Daleen Bo  Nursing Notes Reviewed/ Care Coordinated Applicable Imaging Reviewed Interpretation of Laboratory Data incorporated into ED treatment  The patient appears reasonably screened and/or stabilized for discharge and I doubt any other medical condition or other [facility] requiring further screening, evaluation, or treatment in the ED at this time prior to discharge.  Plan: Home Medications-continue usual medicine; Home Treatments-rest; return here if the recommended treatment, does not improve the symptoms; Recommended follow up-neurosurgery follow-up 1 week and as needed     Final Clinical Impression(s) / ED Diagnoses Final diagnoses:  Compression fracture of body of thoracic vertebra [facility])    Rx / DC Orders ED Discharge Orders         Ordered    oxyCODONE-acetaminophen (PERCOCET) 5-325 MG tablet  Every 4 hours PRN     10/25/19 2336           Daleen Bo, MD 10/25/19 2340

## 2019-10-25 NOTE — Discharge Instructions (Signed)
Rest as much as possible to improve your discomfort.  Use the pain pill, sent to your pharmacy for control of pain.  You could also take either Tylenol or Motrin, to see if that helps.  To keep her stool soft while taking narcotic pain relievers, use Colace, 100 mg twice a day.  Return here, if needed, for problems.

## 2019-10-30 ENCOUNTER — Other Ambulatory Visit: Payer: Self-pay

## 2019-10-30 ENCOUNTER — Emergency Department (HOSPITAL_COMMUNITY)
Admission: EM | Admit: 2019-10-30 | Discharge: 2019-10-30 | Disposition: A | Discharge status: Home / Self Care | Payer: Self-pay

## 2020-03-03 ENCOUNTER — Other Ambulatory Visit (HOSPITAL_COMMUNITY): Payer: Self-pay | Admitting: Nurse Practitioner

## 2020-03-03 DIAGNOSIS — Z1231 Encounter for screening mammogram for malignant neoplasm of breast: Secondary | ICD-10-CM

## 2020-03-07 ENCOUNTER — Ambulatory Visit (HOSPITAL_COMMUNITY)
Admission: RE | Admit: 2020-03-07 | Discharge: 2020-03-07 | Disposition: A | Discharge status: Home / Self Care | Payer: Self-pay | Source: Ambulatory Visit | Attending: Nurse Practitioner | Admitting: Nurse Practitioner

## 2020-03-07 ENCOUNTER — Other Ambulatory Visit: Payer: Self-pay

## 2020-03-07 DIAGNOSIS — Z1231 Encounter for screening mammogram for malignant neoplasm of breast: Secondary | ICD-10-CM | POA: Insufficient documentation

## 2020-09-29 ENCOUNTER — Emergency Department (HOSPITAL_COMMUNITY): Payer: Self-pay

## 2020-09-29 ENCOUNTER — Emergency Department (HOSPITAL_COMMUNITY)
Admission: EM | Admit: 2020-09-29 | Discharge: 2020-09-29 | Disposition: A | Discharge status: Home / Self Care | Payer: Self-pay | Attending: Emergency Medicine | Admitting: Emergency Medicine

## 2020-09-29 ENCOUNTER — Encounter (HOSPITAL_COMMUNITY): Payer: Self-pay | Admitting: Emergency Medicine

## 2020-09-29 ENCOUNTER — Other Ambulatory Visit: Payer: Self-pay

## 2020-09-29 DIAGNOSIS — F17210 Nicotine dependence, cigarettes, uncomplicated: Secondary | ICD-10-CM | POA: Insufficient documentation

## 2020-09-29 DIAGNOSIS — R42 Dizziness and giddiness: Secondary | ICD-10-CM | POA: Insufficient documentation

## 2020-09-29 DIAGNOSIS — Z79899 Other long term (current) drug therapy: Secondary | ICD-10-CM | POA: Insufficient documentation

## 2020-09-29 DIAGNOSIS — I10 Essential (primary) hypertension: Secondary | ICD-10-CM | POA: Insufficient documentation

## 2020-09-29 DIAGNOSIS — E876 Hypokalemia: Secondary | ICD-10-CM | POA: Insufficient documentation

## 2020-09-29 DIAGNOSIS — Z20822 Contact with and (suspected) exposure to covid-19: Secondary | ICD-10-CM | POA: Insufficient documentation

## 2020-09-29 DIAGNOSIS — R7989 Other specified abnormal findings of blood chemistry: Secondary | ICD-10-CM

## 2020-09-29 DIAGNOSIS — R519 Headache, unspecified: Secondary | ICD-10-CM | POA: Insufficient documentation

## 2020-09-29 DIAGNOSIS — R945 Abnormal results of liver function studies: Secondary | ICD-10-CM | POA: Insufficient documentation

## 2020-09-29 HISTORY — DX: Essential (primary) hypertension: I10

## 2020-09-29 LAB — CBC WITH DIFFERENTIAL/PLATELET
Abs Immature Granulocytes: 0.02 10*3/uL (ref 0.00–0.07)
Basophils Absolute: 0 10*3/uL (ref 0.0–0.1)
Basophils Relative: 1 %
Eosinophils Absolute: 0 10*3/uL (ref 0.0–0.5)
Eosinophils Relative: 0 %
HCT: 44.1 % (ref 36.0–46.0)
Hemoglobin: 14.5 g/dL (ref 12.0–15.0)
Immature Granulocytes: 1 %
Lymphocytes Relative: 25 %
Lymphs Abs: 1.1 10*3/uL (ref 0.7–4.0)
MCH: 29.5 pg (ref 26.0–34.0)
MCHC: 32.9 g/dL (ref 30.0–36.0)
MCV: 89.6 fL (ref 80.0–100.0)
Monocytes Absolute: 0.4 10*3/uL (ref 0.1–1.0)
Monocytes Relative: 9 %
Neutro Abs: 2.9 10*3/uL (ref 1.7–7.7)
Neutrophils Relative %: 64 %
Platelets: 182 10*3/uL (ref 150–400)
RBC: 4.92 MIL/uL (ref 3.87–5.11)
RDW: 15.6 % — ABNORMAL HIGH (ref 11.5–15.5)
WBC: 4.4 10*3/uL (ref 4.0–10.5)
nRBC: 0 % (ref 0.0–0.2)

## 2020-09-29 LAB — COMPREHENSIVE METABOLIC PANEL
ALT: 67 U/L — ABNORMAL HIGH (ref 0–44)
AST: 111 U/L — ABNORMAL HIGH (ref 15–41)
Albumin: 4.2 g/dL (ref 3.5–5.0)
Alkaline Phosphatase: 59 U/L (ref 38–126)
Anion gap: 11 (ref 5–15)
BUN: <5 mg/dL — ABNORMAL LOW (ref 6–20)
CO2: 28 mmol/L (ref 22–32)
Calcium: 8.5 mg/dL — ABNORMAL LOW (ref 8.9–10.3)
Chloride: 94 mmol/L — ABNORMAL LOW (ref 98–111)
Creatinine, Ser: 0.52 mg/dL (ref 0.44–1.00)
GFR, Estimated: >60 mL/min (ref >60)
Glucose, Bld: 96 mg/dL (ref 70–99)
Potassium: 2.8 mmol/L — ABNORMAL LOW (ref 3.5–5.1)
Sodium: 133 mmol/L — ABNORMAL LOW (ref 135–145)
Total Bilirubin: 1.4 mg/dL — ABNORMAL HIGH (ref 0.3–1.2)
Total Protein: 8.3 g/dL — ABNORMAL HIGH (ref 6.5–8.1)

## 2020-09-29 LAB — RESP PANEL BY RT-PCR (FLU A&B, COVID) ARPGX2
Influenza A by PCR: NEGATIVE
Influenza B by PCR: NEGATIVE
SARS Coronavirus 2 by RT PCR: NEGATIVE

## 2020-09-29 MED ORDER — MECLIZINE HCL 12.5 MG PO TABS: 12.5000 mg | ORAL_TABLET | Freq: Three times a day (TID) | ORAL | 0 refills | Status: DC | PRN

## 2020-09-29 MED ORDER — POTASSIUM CHLORIDE CRYS ER 20 MEQ PO TBCR
40.0000 meq | EXTENDED_RELEASE_TABLET | Freq: Once | ORAL | Status: AC
  Administered 2020-09-29: 40 meq via ORAL
  Filled 2020-09-29: qty 2

## 2020-09-29 MED ORDER — MAGNESIUM SULFATE 2 GM/50ML IV SOLN
2.0000 g | Freq: Once | INTRAVENOUS | Status: AC
  Administered 2020-09-29: 2 g via INTRAVENOUS
  Filled 2020-09-29: qty 50

## 2020-09-29 MED ORDER — MECLIZINE HCL 12.5 MG PO TABS
25.0000 mg | ORAL_TABLET | Freq: Once | ORAL | Status: AC
  Administered 2020-09-29: 25 mg via ORAL
  Filled 2020-09-29: qty 2

## 2020-09-29 MED ORDER — POTASSIUM CHLORIDE ER 10 MEQ PO TBCR: 10.0000 meq | EXTENDED_RELEASE_TABLET | Freq: Every day | ORAL | 0 refills | Status: DC

## 2020-09-29 MED ORDER — ACETAMINOPHEN 325 MG PO TABS
650.0000 mg | ORAL_TABLET | Freq: Once | ORAL | Status: AC
  Administered 2020-09-29: 650 mg via ORAL
  Filled 2020-09-29: qty 2

## 2020-09-29 MED ORDER — SODIUM CHLORIDE 0.9 % IV BOLUS
1000.0000 mL | Freq: Once | INTRAVENOUS | Status: AC
  Administered 2020-09-29: 1000 mL via INTRAVENOUS

## 2020-09-29 NOTE — Discharge Instructions (Signed)
Take potassium twice a day for the next week. Continue taking her medications as prescribed. Use meclizine as needed for dizziness. Make sure stay well-hydrated water. Your liver function tests were slightly elevated today.  This is likely due to your alcohol use.  I recommend you decrease alcohol use.  Your liver function test and potassium will need to be rechecked in the next week for further evaluation. Return to the emergency room if you develop recurrent dizziness, severe headache, numbness or tingling on one side of body, or any new, worsening, or concerning symptoms

## 2020-09-29 NOTE — ED Triage Notes (Signed)
Emergency Medicine Provider Triage Evaluation Note  Erika Kelley , a 50 y.o. female  was evaluated in triage.  Pt complains of dizziness.  Patient states she was doing dishes when she suddenly felt lightheaded, like she was going to pass out.  At that time, she also saw the room spinning.  Since arrival to the ER, she has developed a headache.  No history of similar.  No history of headaches or dizziness.  No chest pain or shortness of breath.  No recent illness.  Symptoms are worse when standing up and with certain head positions.  Review of Systems  Positive: Dizziness, headache.  Negative: cp  Physical Exam  BP (!) 194/107   Pulse 67   Temp 99.1 F (37.3 C) (Oral)   Resp 17   Ht 5\' 6"  (1.676 m)   Wt 77.1 kg   SpO2 94%   BMI 27.44 kg/m  Gen:   Awake, no distress   HEENT:  Atraumatic  Resp:  Normal effort  Cardiac:  Normal rate  MSK:   Moves extremities without difficulty  Neuro:  Speech clear.  CN intact.  Grip strength equal.  Negative pronator drift.  EOMI.  Medical Decision Making  Medically screening exam initiated at 1:03 PM.  Appropriate orders placed.  was informed that the remainder of the evaluation will be completed by another provider, this initial triage assessment does not replace that evaluation, and the importance of remaining in the ED until their evaluation is complete.  Clinical Impression    Dizziness and headache.  Dizziness is worse with certain position, likely peripheral vertigo.  However in the setting of hypertension and acute onset headache, will obtain labs and CT imaging.  Will treat with meclizine and Tylenol   Dossie Arbour, PA-C 09/29/20 1305

## 2020-09-29 NOTE — ED Provider Notes (Signed)
[facility] EMERGENCY DEPARTMENT Provider Note   CSN: [id number] Arrival date & time: 09/29/20  1131     History Chief Complaint  Patient presents with  . Dizziness    Erika Kelley is a 50 y.o. female presenting for evaluation of dizziness.  Patient states she was doing dishes today when she suddenly got lightheaded and fell like she is in a pass out.  She then started to have dizziness in which she saw the room spinning.  This is worse with certain positions of her head and when she goes from sitting to standing.  She is never had anything like this before.  By the time she got to the ER, she also developed a headache.  She denies recent fevers, cough, chest pain, shortness of breath, nausea, vomiting, abdominal pain, urinary symptoms, abnormal bowel movements.  She has not taken anything for her symptoms.  She denies tobacco or drug use.  She does report intermittent alcohol use, this has increased recently.  She did have several shots last night.  She denies significant Tylenol use.  She has a history of high blood pressure for which she takes medication, no other medical problems.  She did take her blood pressure medication today.  HPI     Past Medical History:  Diagnosis Date  . Hypertension     There are no problems to display for this patient.   Past Surgical History:  Procedure Laterality Date  . CESAREAN SECTION       OB History   No obstetric history on file.     History reviewed. No pertinent family history.  Social History   Tobacco Use  . Smoking status: Current Every Day Smoker    Packs/day: 0.50    Types: Cigarettes  . Smokeless tobacco: Never Used  Substance Use Topics  . Alcohol use: Yes    Comment: occ  . Drug use: No    Home Medications Prior to Admission medications   Medication Sig Start Date End Date Taking? Authorizing Provider  meclizine (ANTIVERT) 12.5 MG tablet Take 1 tablet (12.5 mg total) by mouth 3 (three) times daily as  needed for dizziness. 09/29/20  Yes Osborn Pullin, PA-C  potassium chloride (KLOR-CON) 10 MEQ tablet Take 1 tablet (10 mEq total) by mouth daily for 7 days. 09/29/20 10/06/20 Yes Suri Tafolla, PA-C  diclofenac (VOLTAREN) 75 MG EC tablet Take 1 tablet (75 mg total) by mouth 2 (two) times daily. 12/22/14   Ivery Quale, PA-C  hydrochlorothiazide (HYDRODIURIL) 25 MG tablet Take 1 tablet (25 mg total) by mouth daily. 10/07/17   Triplett, Tammy, PA-C  HYDROcodone-acetaminophen (NORCO/VICODIN) 5-325 MG per tablet Take one-two tabs po q 4-6 hrs prn pain 12/23/14   Triplett, Tammy, PA-C  naproxen sodium (ALEVE) 220 MG tablet Take 220 mg by mouth daily as needed. pain    [provider]  oxyCODONE-acetaminophen (PERCOCET) 5-325 MG tablet Take 1 tablet by mouth every 4 (four) hours as needed for moderate pain or severe pain. 10/25/19   Mancel Bale, MD    Allergies    Patient has no known allergies.  Review of Systems   Review of Systems  Neurological: Positive for dizziness, light-headedness and headaches.  All other systems reviewed and are negative.   Physical Exam Updated Vital Signs BP (!) 150/99   Pulse 67   Temp 99 F (37.2 C) (Oral)   Resp 15   Ht 5\' 6"  (1.676 m)   Wt 77.1 kg   SpO2 96%  BMI 27.44 kg/m   Physical Exam Vitals and nursing note reviewed.  Constitutional:      General: She is not in acute distress.    Appearance: She is well-developed.     Comments: Resting in the bed in no acute distress  HENT:     Head: Normocephalic and atraumatic.  Eyes:     Conjunctiva/sclera: Conjunctivae normal.     Pupils: Pupils are equal, round, and reactive to light.  Cardiovascular:     Rate and Rhythm: Normal rate and regular rhythm.     Pulses: Normal pulses.  Pulmonary:     Effort: Pulmonary effort is normal. No respiratory distress.     Breath sounds: Normal breath sounds. No wheezing.  Abdominal:     General: There is no distension.     Palpations: Abdomen is  soft. There is no mass.     Tenderness: There is no abdominal tenderness. There is no guarding or rebound.     Comments: No TTP of the abdomen  Musculoskeletal:        General: Normal range of motion.     Cervical back: Normal range of motion and neck supple.  Skin:    General: Skin is warm and dry.     Capillary Refill: Capillary refill takes less than 2 seconds.  Neurological:     General: No focal deficit present.     Mental Status: She is alert and oriented to person, place, and time.     GCS: GCS eye subscore is 4. GCS verbal subscore is 5. GCS motor subscore is 6.     Cranial Nerves: Cranial nerves are intact.     Sensory: Sensation is intact.     Motor: Motor function is intact.     Coordination: Coordination is intact.     Gait: Gait is intact.     Comments: GCS of 15.  CN intact.  Nose to finger intact.  Fine movement and coordination intact.  Gait normal.  Negative pronator drift.     ED Results / Procedures / Treatments   Labs (all labs ordered are listed, but only abnormal results are displayed) Labs Reviewed  CBC WITH DIFFERENTIAL/PLATELET - Abnormal; Notable for the following components:      Result Value   RDW 15.6 (*)    All other components within normal limits  COMPREHENSIVE METABOLIC PANEL - Abnormal; Notable for the following components:   Sodium 133 (*)    Potassium 2.8 (*)    Chloride 94 (*)    BUN <5 (*)    Calcium 8.5 (*)    Total Protein 8.3 (*)    AST 111 (*)    ALT 67 (*)    Total Bilirubin 1.4 (*)    All other components within normal limits  RESP PANEL BY RT-PCR (FLU A&B, COVID) ARPGX2  CBC WITH DIFFERENTIAL/PLATELET    EKG None  Radiology CT Head Wo Contrast  Result Date: 09/29/2020 CLINICAL DATA:  Dizziness. EXAM: CT HEAD WITHOUT CONTRAST TECHNIQUE: Contiguous axial images were obtained from the base of the skull through the vertex without intravenous contrast. COMPARISON:  None. FINDINGS: Brain: No evidence of acute large vascular  territory infarction, hemorrhage, hydrocephalus, extra-axial collection or mass lesion/mass effect. Patchy white matter hypodensities, likely the sequela of chronic microvascular ischemic disease. Vascular: No hyperdense vessel identified. Skull: No acute fracture. Sinuses/Orbits: Pansinus mucosal thickening. Other: No mastoid effusions. IMPRESSION: 1. No evidence of acute intracranial abnormality. 2. Pansinus mucosal thickening. Electronically Signed   By:  Feliberto Harts MD   On: 09/29/2020 14:10    Procedures Procedures   Medications Ordered in ED Medications  meclizine (ANTIVERT) tablet 25 mg (25 mg Oral Given 09/29/20 1518)  acetaminophen (TYLENOL) tablet 650 mg (650 mg Oral Given 09/29/20 1518)  potassium chloride SA (KLOR-CON) CR tablet 40 mEq (40 mEq Oral Given 09/29/20 1621)  sodium chloride 0.9 % bolus 1,000 mL (0 mLs Intravenous Stopped 09/29/20 1745)  magnesium sulfate IVPB 2 g 50 mL (0 g Intravenous Stopped 09/29/20 1745)    ED Course  I have reviewed the triage vital signs and the nursing notes.  Pertinent labs & imaging results that were available during my care of the patient were reviewed by me and considered in my medical decision making (see chart for details).    MDM Rules/Calculators/A&P                          Patient presented for evaluation of dizziness.  On exam, patient peers nontoxic.  She has no focal neurologic deficits.  Repeat neuro exam for when I saw her in triage to when I saw her in her room was unchanged.  However patient reports symptoms are significantly improved with meclizine and Tylenol.  Headache completely resolved, dizziness completely resolved.  Blood pressure has improved.  Labs interpreted by me, shows mild hypokalemia of 2.8, replenish in the ED.  We will also replenish mag.  Of note, patient's liver function is slightly elevated.  This is likely secondary to alcohol use.  She has no fevers, chills, nausea, vomiting or abdominal pain, doubt  gallbladder cause.  Discussed findings with patient importance of cessation of alcohol.  She does have a low-grade fever, such we will obtain COVID and flu test.  CT head negative for acute findings.  Orthostatics negative.  In the setting of completely resolved symptoms, no focal neurologic symptoms, doubt stroke.  Likely peripheral vertigo.  Discussed continued symptomatic treatment and importance of follow-up for recheck of potassium and liver function.  At this time, patient appears safe for discharge.  Return precautions given.  Patient states she understands and agrees to plan.  Final Clinical Impression(s) / ED Diagnoses Final diagnoses:  Dizziness  Elevated LFTs  Hypokalemia    Rx / DC Orders ED Discharge Orders         Ordered    potassium chloride (KLOR-CON) 10 MEQ tablet  Daily        09/29/20 1727    meclizine (ANTIVERT) 12.5 MG tablet  3 times daily PRN        09/29/20 1728           Alveria Apley, PA-C 09/29/20 1926    Long, Arlyss Repress, MD 09/30/20 [phone]

## 2020-09-29 NOTE — ED Triage Notes (Signed)
Pt c/o dizziness that started x 2-3 hours ago.

## 2021-04-23 ENCOUNTER — Other Ambulatory Visit (HOSPITAL_COMMUNITY): Payer: Self-pay | Admitting: Nurse Practitioner

## 2021-04-23 DIAGNOSIS — Z1231 Encounter for screening mammogram for malignant neoplasm of breast: Secondary | ICD-10-CM

## 2021-05-04 ENCOUNTER — Ambulatory Visit (HOSPITAL_COMMUNITY): Payer: Medicaid Other

## 2021-05-11 ENCOUNTER — Other Ambulatory Visit: Payer: Self-pay

## 2021-05-11 ENCOUNTER — Ambulatory Visit (HOSPITAL_COMMUNITY)
Admission: RE | Admit: 2021-05-11 | Discharge: 2021-05-11 | Disposition: A | Discharge status: Home / Self Care | Payer: Self-pay | Source: Ambulatory Visit | Attending: Nurse Practitioner | Admitting: Nurse Practitioner

## 2021-05-11 DIAGNOSIS — Z1231 Encounter for screening mammogram for malignant neoplasm of breast: Secondary | ICD-10-CM | POA: Insufficient documentation

## 2021-09-09 ENCOUNTER — Observation Stay (HOSPITAL_COMMUNITY): Payer: Self-pay

## 2021-09-09 ENCOUNTER — Other Ambulatory Visit: Payer: Self-pay

## 2021-09-09 ENCOUNTER — Inpatient Hospital Stay (HOSPITAL_COMMUNITY): Payer: Medicaid Other

## 2021-09-09 ENCOUNTER — Encounter (HOSPITAL_COMMUNITY): Payer: Self-pay

## 2021-09-09 ENCOUNTER — Emergency Department (HOSPITAL_COMMUNITY): Payer: Medicaid Other

## 2021-09-09 ENCOUNTER — Inpatient Hospital Stay (HOSPITAL_COMMUNITY)
Admission: EM | Admit: 2021-09-09 | Discharge: 2021-09-14 | DRG: 917 | Disposition: A | Discharge status: Home / Self Care | Payer: Self-pay | Attending: Internal Medicine | Admitting: Internal Medicine

## 2021-09-09 DIAGNOSIS — G9341 Metabolic encephalopathy: Secondary | ICD-10-CM | POA: Diagnosis present

## 2021-09-09 DIAGNOSIS — F17210 Nicotine dependence, cigarettes, uncomplicated: Secondary | ICD-10-CM | POA: Diagnosis present

## 2021-09-09 DIAGNOSIS — F1210 Cannabis abuse, uncomplicated: Secondary | ICD-10-CM | POA: Diagnosis present

## 2021-09-09 DIAGNOSIS — T50901A Poisoning by unspecified drugs, medicaments and biological substances, accidental (unintentional), initial encounter: Secondary | ICD-10-CM | POA: Diagnosis present

## 2021-09-09 DIAGNOSIS — T405X1A Poisoning by cocaine, accidental (unintentional), initial encounter: Principal | ICD-10-CM | POA: Diagnosis present

## 2021-09-09 DIAGNOSIS — F1010 Alcohol abuse, uncomplicated: Secondary | ICD-10-CM | POA: Diagnosis present

## 2021-09-09 DIAGNOSIS — R54 Age-related physical debility: Secondary | ICD-10-CM | POA: Diagnosis present

## 2021-09-09 DIAGNOSIS — Y92009 Unspecified place in unspecified non-institutional (private) residence as the place of occurrence of the external cause: Secondary | ICD-10-CM

## 2021-09-09 DIAGNOSIS — R739 Hyperglycemia, unspecified: Secondary | ICD-10-CM | POA: Diagnosis not present

## 2021-09-09 DIAGNOSIS — Z79899 Other long term (current) drug therapy: Secondary | ICD-10-CM

## 2021-09-09 DIAGNOSIS — G928 Other toxic encephalopathy: Secondary | ICD-10-CM | POA: Diagnosis present

## 2021-09-09 DIAGNOSIS — D696 Thrombocytopenia, unspecified: Secondary | ICD-10-CM | POA: Diagnosis present

## 2021-09-09 DIAGNOSIS — T40711A Poisoning by cannabis, accidental (unintentional), initial encounter: Secondary | ICD-10-CM | POA: Diagnosis present

## 2021-09-09 DIAGNOSIS — E874 Mixed disorder of acid-base balance: Secondary | ICD-10-CM | POA: Diagnosis present

## 2021-09-09 DIAGNOSIS — Y905 Blood alcohol level of 100-119 mg/100 ml: Secondary | ICD-10-CM | POA: Diagnosis present

## 2021-09-09 DIAGNOSIS — T68XXXA Hypothermia, initial encounter: Secondary | ICD-10-CM | POA: Diagnosis present

## 2021-09-09 DIAGNOSIS — F1410 Cocaine abuse, uncomplicated: Secondary | ICD-10-CM | POA: Diagnosis present

## 2021-09-09 DIAGNOSIS — J9601 Acute respiratory failure with hypoxia: Secondary | ICD-10-CM

## 2021-09-09 DIAGNOSIS — J439 Emphysema, unspecified: Secondary | ICD-10-CM | POA: Diagnosis present

## 2021-09-09 DIAGNOSIS — Z20822 Contact with and (suspected) exposure to covid-19: Secondary | ICD-10-CM | POA: Diagnosis present

## 2021-09-09 DIAGNOSIS — K852 Alcohol induced acute pancreatitis without necrosis or infection: Secondary | ICD-10-CM | POA: Diagnosis present

## 2021-09-09 DIAGNOSIS — E8729 Other acidosis: Secondary | ICD-10-CM | POA: Diagnosis present

## 2021-09-09 DIAGNOSIS — J690 Pneumonitis due to inhalation of food and vomit: Secondary | ICD-10-CM

## 2021-09-09 DIAGNOSIS — K700 Alcoholic fatty liver: Secondary | ICD-10-CM | POA: Diagnosis present

## 2021-09-09 DIAGNOSIS — I10 Essential (primary) hypertension: Secondary | ICD-10-CM | POA: Diagnosis present

## 2021-09-09 DIAGNOSIS — O85 Puerperal sepsis: Secondary | ICD-10-CM

## 2021-09-09 DIAGNOSIS — E876 Hypokalemia: Secondary | ICD-10-CM | POA: Diagnosis not present

## 2021-09-09 DIAGNOSIS — R9431 Abnormal electrocardiogram [ECG] [EKG]: Secondary | ICD-10-CM

## 2021-09-09 DIAGNOSIS — I4891 Unspecified atrial fibrillation: Secondary | ICD-10-CM | POA: Diagnosis present

## 2021-09-09 DIAGNOSIS — N179 Acute kidney failure, unspecified: Secondary | ICD-10-CM | POA: Diagnosis present

## 2021-09-09 DIAGNOSIS — J9602 Acute respiratory failure with hypercapnia: Secondary | ICD-10-CM | POA: Diagnosis present

## 2021-09-09 LAB — CBC WITH DIFFERENTIAL/PLATELET
Abs Immature Granulocytes: 0.06 10*3/uL (ref 0.00–0.07)
Basophils Absolute: 0 10*3/uL (ref 0.0–0.1)
Basophils Relative: 0 %
Eosinophils Absolute: 0 10*3/uL (ref 0.0–0.5)
Eosinophils Relative: 0 %
HCT: 45.1 % (ref 36.0–46.0)
Hemoglobin: 13.7 g/dL (ref 12.0–15.0)
Immature Granulocytes: 1 %
Lymphocytes Relative: 10 %
Lymphs Abs: 0.9 10*3/uL (ref 0.7–4.0)
MCH: 29.9 pg (ref 26.0–34.0)
MCHC: 30.4 g/dL (ref 30.0–36.0)
MCV: 98.5 fL (ref 80.0–100.0)
Monocytes Absolute: 0.7 10*3/uL (ref 0.1–1.0)
Monocytes Relative: 7 %
Neutro Abs: 7.8 10*3/uL — ABNORMAL HIGH (ref 1.7–7.7)
Neutrophils Relative %: 82 %
Platelets: 148 10*3/uL — ABNORMAL LOW (ref 150–400)
RBC: 4.58 MIL/uL (ref 3.87–5.11)
RDW: 15.6 % — ABNORMAL HIGH (ref 11.5–15.5)
WBC: 9.5 10*3/uL (ref 4.0–10.5)
nRBC: 1.1 % — ABNORMAL HIGH (ref 0.0–0.2)

## 2021-09-09 LAB — PROCALCITONIN: Procalcitonin: 0.38 ng/mL

## 2021-09-09 LAB — ECHOCARDIOGRAM COMPLETE
AR max vel: 2.87 cm2
AV Area VTI: 2.77 cm2
AV Area mean vel: 2.87 cm2
AV Mean grad: 5.3 mmHg
AV Peak grad: 11.5 mmHg
Ao pk vel: 1.69 m/s
Area-P 1/2: 3.31 cm2
Height: 66 in
S' Lateral: 2.6 cm
Weight: 2720 oz

## 2021-09-09 LAB — APTT: aPTT: 31 seconds (ref 24–36)

## 2021-09-09 LAB — RAPID URINE DRUG SCREEN, HOSP PERFORMED
Amphetamines: NOT DETECTED
Barbiturates: NOT DETECTED
Benzodiazepines: NOT DETECTED
Cocaine: POSITIVE — AB
Opiates: NOT DETECTED
Tetrahydrocannabinol: POSITIVE — AB

## 2021-09-09 LAB — AMMONIA: Ammonia: 53 umol/L — ABNORMAL HIGH (ref 9–35)

## 2021-09-09 LAB — BASIC METABOLIC PANEL
Anion gap: 7 (ref 5–15)
BUN: 10 mg/dL (ref 6–20)
CO2: 22 mmol/L (ref 22–32)
Calcium: 6.5 mg/dL — ABNORMAL LOW (ref 8.9–10.3)
Chloride: 109 mmol/L (ref 98–111)
Creatinine, Ser: 1.05 mg/dL — ABNORMAL HIGH (ref 0.44–1.00)
GFR, Estimated: >60 mL/min (ref >60)
Glucose, Bld: 133 mg/dL — ABNORMAL HIGH (ref 70–99)
Potassium: 3 mmol/L — ABNORMAL LOW (ref 3.5–5.1)
Sodium: 138 mmol/L (ref 135–145)

## 2021-09-09 LAB — COMPREHENSIVE METABOLIC PANEL
ALT: 150 U/L — ABNORMAL HIGH (ref 0–44)
AST: 494 U/L — ABNORMAL HIGH (ref 15–41)
Albumin: 4 g/dL (ref 3.5–5.0)
Alkaline Phosphatase: 99 U/L (ref 38–126)
Anion gap: 25 — ABNORMAL HIGH (ref 5–15)
BUN: 11 mg/dL (ref 6–20)
CO2: 20 mmol/L — ABNORMAL LOW (ref 22–32)
Calcium: 8.3 mg/dL — ABNORMAL LOW (ref 8.9–10.3)
Chloride: 100 mmol/L (ref 98–111)
Creatinine, Ser: 2.08 mg/dL — ABNORMAL HIGH (ref 0.44–1.00)
GFR, Estimated: 29 mL/min — ABNORMAL LOW (ref >60)
Glucose, Bld: 36 mg/dL — CL (ref 70–99)
Potassium: 3.1 mmol/L — ABNORMAL LOW (ref 3.5–5.1)
Sodium: 145 mmol/L (ref 135–145)
Total Bilirubin: 1.1 mg/dL (ref 0.3–1.2)
Total Protein: 8.2 g/dL — ABNORMAL HIGH (ref 6.5–8.1)

## 2021-09-09 LAB — URINALYSIS, MICROSCOPIC (REFLEX)

## 2021-09-09 LAB — URINALYSIS, ROUTINE W REFLEX MICROSCOPIC
Bilirubin Urine: NEGATIVE
Glucose, UA: 100 mg/dL — AB
Ketones, ur: NEGATIVE mg/dL
Leukocytes,Ua: NEGATIVE
Nitrite: POSITIVE — AB
Protein, ur: 100 mg/dL — AB
Specific Gravity, Urine: 1.025 (ref 1.005–1.030)
pH: 6.5 (ref 5.0–8.0)

## 2021-09-09 LAB — AMYLASE: Amylase: 208 U/L — ABNORMAL HIGH (ref 28–100)

## 2021-09-09 LAB — SALICYLATE LEVEL: Salicylate Lvl: <7 mg/dL — ABNORMAL LOW (ref 7.0–30.0)

## 2021-09-09 LAB — BLOOD GAS, VENOUS
Acid-base deficit: 13.5 mmol/L — ABNORMAL HIGH (ref 0.0–2.0)
Bicarbonate: 18.7 mmol/L — ABNORMAL LOW (ref 20.0–28.0)
Drawn by: 5678
FIO2: 80 %
O2 Saturation: 82.4 %
Patient temperature: 33.5
pCO2, Ven: 63 mmHg — ABNORMAL HIGH (ref 44–60)
pH, Ven: 7.05 — CL (ref 7.25–7.43)
pO2, Ven: 45 mmHg (ref 32–45)

## 2021-09-09 LAB — GLUCOSE, CAPILLARY
Glucose-Capillary: 134 mg/dL — ABNORMAL HIGH (ref 70–99)
Glucose-Capillary: 162 mg/dL — ABNORMAL HIGH (ref 70–99)

## 2021-09-09 LAB — RESP PANEL BY RT-PCR (FLU A&B, COVID) ARPGX2
Influenza A by PCR: NEGATIVE
Influenza B by PCR: NEGATIVE
SARS Coronavirus 2 by RT PCR: NEGATIVE

## 2021-09-09 LAB — ACETAMINOPHEN LEVEL: Acetaminophen (Tylenol), Serum: <10 ug/mL — ABNORMAL LOW (ref 10–30)

## 2021-09-09 LAB — LACTIC ACID, PLASMA
Lactic Acid, Venous: >9 mmol/L (ref 0.5–1.9)
Lactic Acid, Venous: >9 mmol/L (ref 0.5–1.9)
Lactic Acid, Venous: 3.4 mmol/L (ref 0.5–1.9)

## 2021-09-09 LAB — PROTIME-INR
INR: 1.1 (ref 0.8–1.2)
Prothrombin Time: 14.5 seconds (ref 11.4–15.2)

## 2021-09-09 LAB — TROPONIN I (HIGH SENSITIVITY)
Troponin I (High Sensitivity): 211 ng/L (ref <18)
Troponin I (High Sensitivity): 160 ng/L (ref <18)

## 2021-09-09 LAB — POC URINE PREG, ED: Preg Test, Ur: NEGATIVE

## 2021-09-09 LAB — BLOOD GAS, ARTERIAL
Acid-base deficit: 10 mmol/L — ABNORMAL HIGH (ref 0.0–2.0)
Bicarbonate: 19.2 mmol/L — ABNORMAL LOW (ref 20.0–28.0)
Drawn by: 27733
FIO2: 100 %
O2 Saturation: 99.9 %
Patient temperature: 34.7
pCO2 arterial: 50 mmHg — ABNORMAL HIGH (ref 32–48)
pH, Arterial: 7.18 — CL (ref 7.35–7.45)
pO2, Arterial: 107 mmHg (ref 83–108)

## 2021-09-09 LAB — CBG MONITORING, ED
Glucose-Capillary: 185 mg/dL — ABNORMAL HIGH (ref 70–99)
Glucose-Capillary: 226 mg/dL — ABNORMAL HIGH (ref 70–99)
Glucose-Capillary: 27 mg/dL — CL (ref 70–99)
Glucose-Capillary: 158 mg/dL — ABNORMAL HIGH (ref 70–99)
Glucose-Capillary: 98 mg/dL (ref 70–99)

## 2021-09-09 LAB — BRAIN NATRIURETIC PEPTIDE: B Natriuretic Peptide: 206 pg/mL — ABNORMAL HIGH (ref 0.0–100.0)

## 2021-09-09 LAB — ETHANOL: Alcohol, Ethyl (B): 106 mg/dL — ABNORMAL HIGH (ref <10)

## 2021-09-09 LAB — LIPASE, BLOOD: Lipase: 304 U/L — ABNORMAL HIGH (ref 11–51)

## 2021-09-09 LAB — OSMOLALITY: Osmolality: 326 mOsm/kg (ref 275–295)

## 2021-09-09 LAB — MAGNESIUM: Magnesium: 2.1 mg/dL (ref 1.7–2.4)

## 2021-09-09 MED ORDER — SODIUM CHLORIDE 0.9 % IV BOLUS
1000.0000 mL | Freq: Once | INTRAVENOUS | Status: AC
Start: 2021-09-09 — End: 2021-09-09
  Administered 2021-09-09: 1000 mL via INTRAVENOUS

## 2021-09-09 MED ORDER — DILTIAZEM HCL 25 MG/5ML IV SOLN
10.0000 mg | Freq: Once | INTRAVENOUS | Status: AC
  Administered 2021-09-09: 10 mg via INTRAVENOUS
  Filled 2021-09-09: qty 5

## 2021-09-09 MED ORDER — METRONIDAZOLE 500 MG/100ML IV SOLN
500.0000 mg | Freq: Once | INTRAVENOUS | Status: AC
  Administered 2021-09-09: 500 mg via INTRAVENOUS
  Filled 2021-09-09: qty 100

## 2021-09-09 MED ORDER — IPRATROPIUM-ALBUTEROL 0.5-2.5 (3) MG/3ML IN SOLN
3.0000 mL | RESPIRATORY_TRACT | Status: DC
Start: 2021-09-09 — End: 2021-09-10
  Administered 2021-09-09 – 2021-09-10 (×8): 3 mL via RESPIRATORY_TRACT
  Filled 2021-09-09 (×9): qty 3

## 2021-09-09 MED ORDER — POTASSIUM CHLORIDE 20 MEQ PO PACK: 40.0000 meq | PACK | Freq: Once | ORAL | Status: DC

## 2021-09-09 MED ORDER — CHLORHEXIDINE GLUCONATE 0.12 % MT SOLN
15.0000 mL | Freq: Two times a day (BID) | OROMUCOSAL | Status: DC
  Administered 2021-09-09: 15 mL via OROMUCOSAL
  Filled 2021-09-09: qty 15

## 2021-09-09 MED ORDER — CALCIUM GLUCONATE-NACL 1-0.675 GM/50ML-% IV SOLN
1.0000 g | Freq: Once | INTRAVENOUS | Status: AC
  Administered 2021-09-09: 1000 mg via INTRAVENOUS
  Filled 2021-09-09: qty 50

## 2021-09-09 MED ORDER — POTASSIUM CHLORIDE 20 MEQ PO PACK
40.0000 meq | PACK | Freq: Once | ORAL | Status: DC
Start: 2021-09-09 — End: 2021-09-09
  Filled 2021-09-09: qty 2

## 2021-09-09 MED ORDER — VANCOMYCIN HCL IN DEXTROSE 1-5 GM/200ML-% IV SOLN
1000.0000 mg | Freq: Once | INTRAVENOUS | Status: AC
  Administered 2021-09-09: 1000 mg via INTRAVENOUS
  Filled 2021-09-09: qty 200

## 2021-09-09 MED ORDER — CHLORHEXIDINE GLUCONATE CLOTH 2 % EX PADS
6.0000 | MEDICATED_PAD | Freq: Every day | CUTANEOUS | Status: DC
  Administered 2021-09-09 – 2021-09-12 (×4): 6 via TOPICAL

## 2021-09-09 MED ORDER — POTASSIUM CHLORIDE 10 MEQ/100ML IV SOLN
10.0000 meq | Freq: Once | INTRAVENOUS | Status: AC
  Administered 2021-09-09: 10 meq via INTRAVENOUS
  Filled 2021-09-09: qty 100

## 2021-09-09 MED ORDER — KCL IN DEXTROSE-NACL 20-5-0.45 MEQ/L-%-% IV SOLN
INTRAVENOUS | Status: DC
  Filled 2021-09-09 (×4): qty 1000

## 2021-09-09 MED ORDER — FUROSEMIDE 10 MG/ML IJ SOLN
40.0000 mg | Freq: Once | INTRAMUSCULAR | Status: AC
  Administered 2021-09-09: 40 mg via INTRAVENOUS
  Filled 2021-09-09: qty 4

## 2021-09-09 MED ORDER — SODIUM CHLORIDE 0.9 % IV SOLN: INTRAVENOUS | Status: DC

## 2021-09-09 MED ORDER — SODIUM CHLORIDE 0.9 % IV SOLN
3.0000 g | Freq: Four times a day (QID) | INTRAVENOUS | Status: AC
  Administered 2021-09-09 – 2021-09-14 (×20): 3 g via INTRAVENOUS
  Filled 2021-09-09 (×20): qty 8

## 2021-09-09 MED ORDER — SODIUM CHLORIDE 0.9 % IV SOLN: INTRAVENOUS | Status: DC | PRN

## 2021-09-09 MED ORDER — FOLIC ACID 1 MG PO TABS
1.0000 mg | ORAL_TABLET | Freq: Every day | ORAL | Status: DC
  Administered 2021-09-09 – 2021-09-14 (×6): 1 mg via ORAL
  Filled 2021-09-09 (×6): qty 1

## 2021-09-09 MED ORDER — THIAMINE HCL 100 MG/ML IJ SOLN: 100.0000 mg | Freq: Every day | INTRAMUSCULAR | Status: DC

## 2021-09-09 MED ORDER — ADULT MULTIVITAMIN W/MINERALS CH
1.0000 | ORAL_TABLET | Freq: Every day | ORAL | Status: DC
  Administered 2021-09-09 – 2021-09-14 (×6): 1 via ORAL
  Filled 2021-09-09 (×6): qty 1

## 2021-09-09 MED ORDER — ORAL CARE MOUTH RINSE: 15.0000 mL | Freq: Two times a day (BID) | OROMUCOSAL | Status: DC

## 2021-09-09 MED ORDER — DILTIAZEM HCL-DEXTROSE 125-5 MG/125ML-% IV SOLN (PREMIX)
5.0000 mg/h | INTRAVENOUS | Status: DC
  Administered 2021-09-09: 5 mg/h via INTRAVENOUS
  Filled 2021-09-09: qty 125

## 2021-09-09 MED ORDER — NALOXONE HCL 2 MG/2ML IJ SOSY
2.0000 mg | PREFILLED_SYRINGE | Freq: Once | INTRAMUSCULAR | Status: AC
Start: 2021-09-09 — End: 2021-09-09
  Administered 2021-09-09: 2 mg via INTRAVENOUS

## 2021-09-09 MED ORDER — SODIUM CHLORIDE 0.9 % IV BOLUS
2000.0000 mL | Freq: Once | INTRAVENOUS | Status: AC
  Administered 2021-09-09: 2000 mL via INTRAVENOUS

## 2021-09-09 MED ORDER — PHENOBARBITAL SODIUM 130 MG/ML IJ SOLN
130.0000 mg | Freq: Three times a day (TID) | INTRAMUSCULAR | Status: DC
  Administered 2021-09-09 (×2): 130 mg via INTRAVENOUS
  Filled 2021-09-09 (×2): qty 1

## 2021-09-09 MED ORDER — DEXTROSE 50 % IV SOLN
INTRAVENOUS | Status: AC
  Administered 2021-09-09: 25 g via INTRAVENOUS
  Filled 2021-09-09: qty 50

## 2021-09-09 MED ORDER — PANTOPRAZOLE SODIUM 40 MG IV SOLR
40.0000 mg | INTRAVENOUS | Status: DC
  Administered 2021-09-09 – 2021-09-10 (×2): 40 mg via INTRAVENOUS
  Filled 2021-09-09 (×2): qty 10

## 2021-09-09 MED ORDER — NICOTINE 21 MG/24HR TD PT24
21.0000 mg | MEDICATED_PATCH | Freq: Every day | TRANSDERMAL | Status: DC
  Administered 2021-09-09 – 2021-09-14 (×6): 21 mg via TRANSDERMAL
  Filled 2021-09-09 (×6): qty 1

## 2021-09-09 MED ORDER — THIAMINE HCL 100 MG PO TABS
100.0000 mg | ORAL_TABLET | Freq: Every day | ORAL | Status: DC
  Administered 2021-09-09 – 2021-09-14 (×6): 100 mg via ORAL
  Filled 2021-09-09 (×6): qty 1

## 2021-09-09 MED ORDER — SODIUM CHLORIDE 0.9 % IV BOLUS
1000.0000 mL | Freq: Once | INTRAVENOUS | Status: AC
  Administered 2021-09-09: 1000 mL via INTRAVENOUS

## 2021-09-09 MED ORDER — NALOXONE HCL 2 MG/2ML IJ SOSY
2.0000 mg | PREFILLED_SYRINGE | Freq: Once | INTRAMUSCULAR | Status: AC
  Administered 2021-09-09: 2 mg via INTRAVENOUS

## 2021-09-09 MED ORDER — DEXTROSE 50 % IV SOLN
25.0000 g | Freq: Once | INTRAVENOUS | Status: AC
Start: 2021-09-09 — End: 2021-09-09

## 2021-09-09 MED ORDER — CEFEPIME HCL 2 G IJ SOLR
2.0000 g | Freq: Once | INTRAMUSCULAR | Status: AC
  Administered 2021-09-09: 2 g via INTRAVENOUS
  Filled 2021-09-09: qty 12.5

## 2021-09-09 MED ORDER — NALOXONE HCL 4 MG/10ML IJ SOLN
0.0000 mg/h | INTRAVENOUS | Status: DC
  Administered 2021-09-09 – 2021-09-10 (×3): 0.5 mg/h via INTRAVENOUS
  Filled 2021-09-09 (×5): qty 10

## 2021-09-09 MED ORDER — POTASSIUM CHLORIDE 10 MEQ/100ML IV SOLN
10.0000 meq | INTRAVENOUS | Status: AC
  Administered 2021-09-09 – 2021-09-10 (×6): 10 meq via INTRAVENOUS
  Filled 2021-09-09 (×6): qty 100

## 2021-09-09 NOTE — Progress Notes (Signed)
Work note provided for patient's brother, Erika Kelley. ?

## 2021-09-09 NOTE — Progress Notes (Signed)
**Note De-identified Erika Kelley Obfuscation** ABG collected and delivered to lab ?

## 2021-09-09 NOTE — ED Notes (Signed)
Pt oxygen declines while she is sleeping. [place] turned up to 7 lpm.  ?

## 2021-09-09 NOTE — H&P (Signed)
See consult note by Dr. Sherene Sires on 09/09/21 ? ?Steffanie Dunn, DO 09/09/21 5:22 PM ?[facility] ? ?

## 2021-09-09 NOTE — ED Notes (Signed)
Pt unable to titrate to nasal cannula. Oxygen dropped to 87% on 9lpm. Non rebreather reapplied at 15 lpm./ ?

## 2021-09-09 NOTE — ED Provider Notes (Signed)
?[facility] EMERGENCY DEPARTMENT ?Provider Note ? ? ?CSN: [id number] ?Arrival date & time: 09/09/21  0825 ? ?  ? ?History ? ?Chief Complaint  ?Patient presents with  ? Altered Mental Status  ? ? ?Erika Kelley is a 51 y.o. female. ? ?Patient was found unresponsive at home.  Paramedics checked her glucose and it was 130.  When she arrived in the emergency department she was not responding to verbal or painful stimuli.  Patient was given Narcan and she awoke to where she was confused but awake.  Then her glucose dropped down to the 30s and she improved again with D50.  Eventually patient was given a second dose of Narcan and awoke to where she was oriented to person and was able to answer questions appropriately.  Patient has a history of hypertension ? ?The history is provided by the EMS personnel. No language interpreter was used.  ?Altered Mental Status ?Presenting symptoms: behavior changes   ?Severity:  Severe ?Most recent episode:  Today ?Episode history:  Single ?Timing:  Constant ?Progression:  Waxing and waning ?Chronicity:  New ?Context: alcohol use   ?Associated symptoms: no abdominal pain   ? ?  ? ?Home Medications ?Prior to Admission medications   ?Medication Sig Start Date End Date Taking? Authorizing Provider  ?hydrochlorothiazide (HYDRODIURIL) 25 MG tablet Take 1 tablet (25 mg total) by mouth daily. 10/07/17  Yes Triplett, Tammy, PA-C  ?naproxen sodium (ALEVE) 220 MG tablet Take 220 mg by mouth daily as needed. pain   Yes [provider]  ?diclofenac (VOLTAREN) 75 MG EC tablet Take 1 tablet (75 mg total) by mouth 2 (two) times daily. ?Patient not taking: Reported on 09/09/2021 12/22/14   Ivery Quale, PA-C  ?HYDROcodone-acetaminophen (NORCO/VICODIN) 5-325 MG per tablet Take one-two tabs po q 4-6 hrs prn pain ?Patient not taking: Reported on 09/09/2021 12/23/14   Pauline Aus, PA-C  ?meclizine (ANTIVERT) 12.5 MG tablet Take 1 tablet (12.5 mg total) by mouth 3 (three) times daily as needed for  dizziness. ?Patient not taking: Reported on 09/09/2021 09/29/20   Caccavale, Sophia, PA-C  ?oxyCODONE-acetaminophen (PERCOCET) 5-325 MG tablet Take 1 tablet by mouth every 4 (four) hours as needed for moderate pain or severe pain. ?Patient not taking: Reported on 09/09/2021 10/25/19   Mancel Bale, MD  ?potassium chloride (KLOR-CON) 10 MEQ tablet Take 1 tablet (10 mEq total) by mouth daily for 7 days. ?Patient not taking: Reported on 09/09/2021 09/29/20 10/06/20  Alveria Apley, PA-C  ?   ? ?Allergies    ?Patient has no known allergies.   ? ?Review of Systems   ?Review of Systems  ?Unable to perform ROS: Mental status change  ?Gastrointestinal:  Negative for abdominal pain.  ? ?Physical Exam ?Updated Vital Signs ?BP 94/80   Pulse 88   Temp (!) 97 ?F (36.1 ?C)   Resp (!) 7   Ht 5\' 6"  (1.676 m)   Wt 77.1 kg   SpO2 96%   BMI 27.44 kg/m?  ?Physical Exam ?Vitals and nursing note reviewed.  ?Constitutional:   ?   Appearance: She is well-developed.  ?   Comments: Patient lethargic not responding to verbal or painful stimuli  ?HENT:  ?   Head: Normocephalic.  ?   Nose: Nose normal.  ?Eyes:  ?   General: No scleral icterus. ?   Conjunctiva/sclera: Conjunctivae normal.  ?Neck:  ?   Thyroid: No thyromegaly.  ?Cardiovascular:  ?   Rate and Rhythm: Tachycardia present. Rhythm irregular.  ?  Heart sounds: No murmur heard. ?  No friction rub. No gallop.  ?Pulmonary:  ?   Breath sounds: No stridor. No wheezing or rales.  ?Chest:  ?   Chest wall: No tenderness.  ?Abdominal:  ?   General: There is no distension.  ?   Tenderness: There is no abdominal tenderness. There is no rebound.  ?Musculoskeletal:     ?   General: Normal range of motion.  ?   Cervical back: Neck supple.  ?Lymphadenopathy:  ?   Cervical: No cervical adenopathy.  ?Skin: ?   Findings: No erythema or rash.  ?Neurological:  ?   Motor: No abnormal muscle tone.  ?   Coordination: Coordination normal.  ?   Comments: Patient moving extremities not responding to verbal  stimuli or painful stimuli  ? ? ?ED Results / Procedures / Treatments   ?Labs ?(all labs ordered are listed, but only abnormal results are displayed) ?Labs Reviewed  ?LACTIC ACID, PLASMA - Abnormal; Notable for the following components:  ?    Result Value  ? Lactic Acid, Venous >9.0 (*)   ? All other components within normal limits  ?LACTIC ACID, PLASMA - Abnormal; Notable for the following components:  ? Lactic Acid, Venous >9.0 (*)   ? All other components within normal limits  ?COMPREHENSIVE METABOLIC PANEL - Abnormal; Notable for the following components:  ? Potassium 3.1 (*)   ? CO2 20 (*)   ? Glucose, Bld 36 (*)   ? Creatinine, Ser 2.08 (*)   ? Calcium 8.3 (*)   ? Total Protein 8.2 (*)   ? AST 494 (*)   ? ALT 150 (*)   ? GFR, Estimated 29 (*)   ? Anion gap 25 (*)   ? All other components within normal limits  ?CBC WITH DIFFERENTIAL/PLATELET - Abnormal; Notable for the following components:  ? RDW 15.6 (*)   ? Platelets 148 (*)   ? nRBC 1.1 (*)   ? Neutro Abs 7.8 (*)   ? All other components within normal limits  ?URINALYSIS, ROUTINE W REFLEX MICROSCOPIC - Abnormal; Notable for the following components:  ? Glucose, UA 100 (*)   ? Hgb urine dipstick LARGE (*)   ? Protein, ur 100 (*)   ? Nitrite POSITIVE (*)   ? All other components within normal limits  ?RAPID URINE DRUG SCREEN, HOSP PERFORMED - Abnormal; Notable for the following components:  ? Cocaine POSITIVE (*)   ? Tetrahydrocannabinol POSITIVE (*)   ? All other components within normal limits  ?BRAIN NATRIURETIC PEPTIDE - Abnormal; Notable for the following components:  ? B Natriuretic Peptide 206.0 (*)   ? All other components within normal limits  ?BLOOD GAS, VENOUS - Abnormal; Notable for the following components:  ? pH, Ven 7.05 (*)   ? pCO2, Ven 63 (*)   ? Bicarbonate 18.7 (*)   ? Acid-base deficit 13.5 (*)   ? All other components within normal limits  ?ETHANOL - Abnormal; Notable for the following components:  ? Alcohol, Ethyl (B) 106 (*)   ? All  other components within normal limits  ?BLOOD GAS, ARTERIAL - Abnormal; Notable for the following components:  ? pH, Arterial 7.18 (*)   ? pCO2 arterial 50 (*)   ? Bicarbonate 19.2 (*)   ? Acid-base deficit 10.0 (*)   ? All other components within normal limits  ?SALICYLATE LEVEL - Abnormal; Notable for the following components:  ? Salicylate Lvl <7.0 (*)   ? All  other components within normal limits  ?URINALYSIS, MICROSCOPIC (REFLEX) - Abnormal; Notable for the following components:  ? Bacteria, UA MANY (*)   ? All other components within normal limits  ?CBG MONITORING, ED - Abnormal; Notable for the following components:  ? Glucose-Capillary 27 (*)   ? All other components within normal limits  ?CBG MONITORING, ED - Abnormal; Notable for the following components:  ? Glucose-Capillary 226 (*)   ? All other components within normal limits  ?CBG MONITORING, ED - Abnormal; Notable for the following components:  ? Glucose-Capillary 185 (*)   ? All other components within normal limits  ?CBG MONITORING, ED - Abnormal; Notable for the following components:  ? Glucose-Capillary 158 (*)   ? All other components within normal limits  ?TROPONIN I (HIGH SENSITIVITY) - Abnormal; Notable for the following components:  ? Troponin I (High Sensitivity) 211 (*)   ? All other components within normal limits  ?TROPONIN I (HIGH SENSITIVITY) - Abnormal; Notable for the following components:  ? Troponin I (High Sensitivity) 160 (*)   ? All other components within normal limits  ?CULTURE, BLOOD (ROUTINE X 2)  ?CULTURE, BLOOD (ROUTINE X 2)  ?URINE CULTURE  ?RESP PANEL BY RT-PCR (FLU A&B, COVID) ARPGX2  ?PROTIME-INR  ?APTT  ?MAGNESIUM  ?OSMOLALITY  ?PROCALCITONIN  ?POC URINE PREG, ED  ?CBG MONITORING, ED  ? ? ?EKG ?EKG Interpretation ? ?Date/Time:  Wednesday September 09 2021 08:30:22 EDT ?Ventricular Rate:  130 ?PR Interval:    ?QRS Duration: 99 ?QT Interval:  371 ?QTC Calculation: 546 ?R Axis:   82 ?Text Interpretation: Atrial fibrillation  Borderline repolarization abnormality Prolonged QT interval Baseline wander in lead(s) V6 Confirmed by Bethann BerkshireZammit, Candon Caras [phone]([id number]) on 09/09/2021 8:41:52 AM ? ?Radiology ?CT Head Wo Contrast ? ?Result Date: 09/09/2021 ?CLI

## 2021-09-09 NOTE — Progress Notes (Signed)
*  PRELIMINARY RESULTS* ?Echocardiogram ?2D Echocardiogram has been performed. ? ?Stacey Drain ?09/09/2021, 2:17 PM ?

## 2021-09-09 NOTE — ED Notes (Signed)
Attempted to call report to [facility] -[phone] no answer ?

## 2021-09-09 NOTE — ED Triage Notes (Signed)
Patient via EMS due to unresponsive patient. Patient arrived to find patient unresponsive with labored breathing.  ?

## 2021-09-09 NOTE — Progress Notes (Signed)
Pharmacy Antibiotic Note ? ?Erika Kelley is a 51 y.o. female admitted on 09/09/2021 with aspiration pneumonia.  Pharmacy has been consulted for Unasyn dosing. ? ?Plan: ?Unasyn 3g IV q8h ?Follow up renal function & cultures ? ?Height: 5\' 6"  (167.6 cm) ?Weight: 77.1 kg (170 lb) ?IBW/kg (Calculated) : 59.3 ? ?Temp (24hrs), Avg:94.1 ?F (34.5 ?C), Min:89.7 ?F (32.1 ?C), Max:97.3 ?F (36.3 ?C) ? ?Recent Labs  ?Lab 09/09/21 ?0828 09/09/21 ?1032  ?WBC 9.5  --   ?CREATININE 2.08*  --   ?LATICACIDVEN >9.0* >9.0*  ?  ?Estimated Creatinine Clearance: 34.7 mL/min (A) (by C-G formula based on SCr of 2.08 mg/dL (H)).   ? ?No Known Allergies ? ?Antimicrobials this admission: ?4/5 Vanc/Cefepime/Flagyl x 1 ?4/5 Unasyn >> ? ?Dose adjustments this admission: ? ?Microbiology results: ?30/5 BCx: ?4/5 UCx: ? ?Thank you for allowing pharmacy to be a part of this patient?s care. ? ?6/11, PharmD, BCPS ?Pharmacy: [facility] ?09/09/2021 4:09 PM ? ?

## 2021-09-09 NOTE — ED Notes (Signed)
Attempted report to [facility] [phone] ?

## 2021-09-09 NOTE — ED Notes (Signed)
Date and time results received: 09/09/21 1238 ? ? ?Test: Arterial PH  ?Critical Value: 7.18 ? ?Name of Provider Notified: Dr. Roderic Palau ? ?Orders Received? Or Actions Taken?: Notified ? ?

## 2021-09-09 NOTE — ED Notes (Signed)
Patient placed on Bair hugger for rewarming.  ?

## 2021-09-09 NOTE — Consult Note (Signed)
? ?NAME:  Erika Kelley, MRN:  [id number], DOB:  26-Aug-1970, LOS: 0 ?ADMISSION DATE:  09/09/2021, CONSULTATION DATE:  4/5 ?REFERRING MD:  Zammitt/ EDP, CHIEF COMPLAINT:  AMS / hypothermia  ? ?History of Present Illness:  ?35 yobf smoker with h/o heavy etoh use found unresponsive this am and brought to [facility] ER with findings of pin point pupils and low blood sugar, hypothermia, combined metabolic and resp acidosis, high Etoh levels and pos drug screen for cocaine and THC.  After narcan she woke up and gave hx of just drinking vodka all day, no food, and denied any unusual exposures to ASA or products containing Ethylene glycol or methanol .  C/o mild sob p ? Aspirated on arrival though initial cxr ok. ? ?PCCM asked to eval for icu admit here vs [facility] and because of lack of nursing personnel /bed in ICU rec is to transfer to [facility] for careful observation with possible intubation over next 24 h ? ?  ? ?Significant Hospital Events: ?Including procedures, antibiotic start and stop dates in addition to other pertinent events   ?CT head 4/5  atrophy with No acute intracranial abnormalities. ? ?Interim History / Subjective:  ?Feeling better, mild sob, congested sounding cough, no cp abd pain or nausea  ? ?Objective   ?Blood pressure 111/84, pulse (!) 115, temperature (!) 96.4 ?F (35.8 ?C), resp. rate 14, height $RemoveBe'5\' 6"'[id number]$  (1.676 m), weight 77.1 kg, SpO2 96 %. ?   ?   ?No intake or output data in the 24 hours ending 09/09/21 1303 ?Filed Weights  ? 09/09/21 0923 09/09/21 0834  ?Weight: 77.1 kg 77.1 kg  ? ? ?Examination: ? Tmax:  97  on bare hugger  ?General appearance:    acutely ill appearing though no increased wb   ?At Rest 02 sats  96% on NRM  ?No jvd ?Oropharynx clear,  mucosa nl ?Neck supple ?Lungs with coarse, junky exp > insp rhonchi bilaterally ?IRIR no s3 or or sign murmur ?Abd soft/ not tender nl  excursion  ?Extr warm with no edema or clubbing noted ?Neuro  Sensorium woke up oriented to person and place,  no apparent  motor deficits  ? ? ?I personally reviewed images and agree with radiology impression as follows:  ?CXR:   portable am 4/5 ?Trachea is midline. Heart size stable. Mild interstitial prominence ?and indistinctness bilaterally. No pleural fluid. ? ?Resolved Hospital Problem list   ?  ? ?Assessment & Plan:  ?1) Acute resp failure/ hypercarbic and hypoxemic in setting of opiate/etoh abuse and cocaine use  ?>>> actually looks ok lying flat now but concerned may evolve to need ET / vent over next 24 h ?>>> check PCT baseline, no def role for abx at this point  ? ?2)  Rapid afib better on cardizem drip ? 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 ?4) Met acidosis/ Acute renal failure likely related to ketosis / dehyrdration ?>>> hydrate cautiously with dextrose containing fluids if BG less than 140 ?Lab Results  ?Component Value Date  ? CREATININE 2.08 (H) 09/09/2021  ? CREATININE 0.52 09/29/2020  ? CREATININE 0.59 10/25/2019  ?  ? ?5)  Etohism with level over 100 on admit/ risk for DT's over next sev days  ? ?6) Elevated LFTts and low fasting glucose c/w acute/ chronic etoh related liver dz  ?>>> rx thiamine/observe for dts' ? ?Best Practice (right click and "Reselect all SmartList Selections" daily)  ? ?Diet/type: NPO ?DVT  prophylaxis: SCD ?GI prophylaxis: PPI ?Lines: N/A ?Foley:  N/A ?Code Status:  full code ?Last date of multidisciplinary goals of care discussion pending  ? ?Labs   ?CBC: ?Recent Labs  ?Lab 09/09/21 ?0828  ?WBC 9.5  ?NEUTROABS 7.8*  ?HGB 13.7  ?HCT 45.1  ?MCV 98.5  ?PLT 148*  ? ? ?Basic Metabolic Panel: ?Recent Labs  ?Lab 09/09/21 ?0828  ?NA 145  ?K 3.1*  ?CL 100  ?CO2 20*  ?GLUCOSE 36*  ?BUN 11  ?CREATININE 2.08*  ?CALCIUM 8.3*  ? ?GFR: ?Estimated Creatinine Clearance: 34.7 mL/min (A) (by C-G formula based on SCr of 2.08 mg/dL (H)). ?Recent Labs  ?Lab 09/09/21 ?0828 09/09/21 ?1032  ?WBC 9.5  --   ?LATICACIDVEN >9.0* >9.0*  ? ? ?Liver Function Tests: ?Recent  Labs  ?Lab 09/09/21 ?0828  ?AST 494*  ?ALT 150*  ?ALKPHOS 99  ?BILITOT 1.1  ?PROT 8.2*  ?ALBUMIN 4.0  ? ?No results for input(s): LIPASE, AMYLASE in the last 168 hours. ?No results for input(s): AMMONIA in the last 168 hours. ? ?ABG ?   ?Component Value Date/Time  ? PHART 7.18 (LL) 09/09/2021 1147  ? PCO2ART 50 (H) 09/09/2021 1147  ? PO2ART 107 09/09/2021 1147  ? HCO3 19.2 (L) 09/09/2021 1147  ? ACIDBASEDEF 10.0 (H) 09/09/2021 1147  ? O2SAT 99.9 09/09/2021 1147  ?  ? ?Coagulation Profile: ?Recent Labs  ?Lab 09/09/21 ?0828  ?INR 1.1  ? ? ?Cardiac Enzymes: ?No results for input(s): CKTOTAL, CKMB, CKMBINDEX, TROPONINI in the last 168 hours. ? ?HbA1C: ?No results found for: HGBA1C ? ?CBG: ?Recent Labs  ?Lab 09/09/21 ?0836 09/09/21 ?4665 09/09/21 ?0855 09/09/21 ?1014 09/09/21 ?1236  ?GLUCAP 27* 226* 185* 158* 98  ? ? ?  ? ?Past Medical History:  ?She,  has a past medical history of Hypertension.  ? ?Surgical History:  ? ?Past Surgical History:  ?Procedure Laterality Date  ? CESAREAN SECTION    ?  ? ?Social History:  ? reports that she has been smoking cigarettes. She has been smoking an average of .5 packs per day. She has never used smokeless tobacco. She reports current alcohol use. She reports that she does not use drugs.  ? ?Family History:  ?Her family history is not on file.  ? ?Allergies ?No Known Allergies  ? ?Home Medications  ?Prior to Admission medications   ?Medication Sig Start Date End Date Taking? Authorizing Provider  ?hydrochlorothiazide (HYDRODIURIL) 25 MG tablet Take 1 tablet (25 mg total) by mouth daily. 10/07/17  Yes Triplett, Tammy, PA-C  ?naproxen sodium (ALEVE) 220 MG tablet Take 220 mg by mouth daily as needed. pain   Yes [provider]  ?diclofenac (VOLTAREN) 75 MG EC tablet Take 1 tablet (75 mg total) by mouth 2 (two) times daily. ?Patient not taking: Reported on 09/09/2021 12/22/14   Lily Kocher, PA-C  ?HYDROcodone-acetaminophen (NORCO/VICODIN) 5-325 MG per tablet Take one-two tabs po  q 4-6 hrs prn pain ?Patient not taking: Reported on 09/09/2021 12/23/14   Kem Parkinson, PA-C  ?meclizine (ANTIVERT) 12.5 MG tablet Take 1 tablet (12.5 mg total) by mouth 3 (three) times daily as needed for dizziness. ?Patient not taking: Reported on 09/09/2021 09/29/20   Caccavale, Sophia, PA-C  ?oxyCODONE-acetaminophen (PERCOCET) 5-325 MG tablet Take 1 tablet by mouth every 4 (four) hours as needed for moderate pain or severe pain. ?Patient not taking: Reported on 09/09/2021 10/25/19   Daleen Bo, MD  ?potassium chloride (KLOR-CON) 10 MEQ tablet Take 1 tablet (10 mEq total) by mouth  daily for 7 days. ?Patient not taking: Reported on 09/09/2021 09/29/20 10/06/20  Franchot Heidelberg, PA-C  ?  ? ?  ?The patient is critically ill with multiple organ systems failure and requires high complexity decision making for assessment and support, frequent evaluation and titration of therapies, application of advanced monitoring technologies and extensive interpretation of multiple databases. Critical Care Time devoted to patient care services described in this note is 45 minutes. ? ? ?Christinia Gully, MD ?Pulmonary and Critical Care Medicine ?[place] ?Cell [phone]  ? ?  ? ? ? ? ?

## 2021-09-09 NOTE — ED Notes (Signed)
Patient becoming more responsive after Narcan administration.  ?

## 2021-09-09 NOTE — Progress Notes (Addendum)
?  PCCM Interval Note ? ?Patient arrived from [facility] on NRB and narcan gtt > 0.5 mg/hr.  ? ?Remains in NSR and off cardizem gtt.  At this time she is able to maintain conversation, oriented to person, place, and year.  No increased work of breathing and remains calm.  States she drinks liquor "a lot" but never would quantify for years, more heavily since December.  ? ? ?Blood pressure (!) 134/92, pulse 79, temperature (!) 97.3 ?F (36.3 ?C), temperature source Axillary, resp. rate 11, height 5\' 6"  (1.676 m), weight 77.1 kg, SpO2 (!) 92 %. ? ? ?Hypoxic resp failure/ aspiration pneumonia  ?- Plans to switch to HFNC from NRB and monitor closely.  Remains high risk for intubation.  Adding unasyn.  ? ? ?Prolonged Qtc measuring ~600 currently.   ?-Cont tele monitoring.  Will avoid Qtc prolonging meds and maximize electrolytes> K being repleted, will recheck BMET now, Mag 2.1, and calcium 8.3> will give calcium gluconate 1gm now ? ? ?AGMA ?AKI  ?- recheck BMET now ?- recheck lactate ? ? ?Elevated LFTs and lipase ?- discriminant score 8, no need for steroids ?- recheck LFTs and lipase in am  ?- RUQ ?- check tylenol level ? ? ?Polysubstance abuse ?High risk for ETOH withdrawal ?- patient reports she has never stopped before to know if she gets symptomatic ?- will start CIWA protocol to monitor and phenobarbital.  Adding MVI, folate, thiamine ?- wean narcan gtt as able  ? ? ? ?Additional CCT 20 mins ? ? ?Addendum 1845- called to bedside given worsening hypoxia.  She is on HFNC and NRB and saturations ~90%.  No increased work of breathing.  She denies any SOB.  She is still awake and interactive.  Increased bibasilar rales.  MIVF already stopped.  Will change to [facility] given no increased work of breathing, otherwise will need BiPAP.  Also, she received 4L + pta, will give dose of lasix and change to IV KCL replacement.   ? ? ?[facility], ACNP ?[facility] ?09/09/2021, 5:04 PM ? ?See Amion for pager ?If no  response to pager, please call PCCM consult pager ?After 7:00 pm call Elink   ? ? ?

## 2021-09-10 ENCOUNTER — Inpatient Hospital Stay (HOSPITAL_COMMUNITY): Payer: Medicaid Other

## 2021-09-10 LAB — CBC
HCT: 34.9 % — ABNORMAL LOW (ref 36.0–46.0)
Hemoglobin: 11.9 g/dL — ABNORMAL LOW (ref 12.0–15.0)
MCH: 30.6 pg (ref 26.0–34.0)
MCHC: 34.1 g/dL (ref 30.0–36.0)
MCV: 89.7 fL (ref 80.0–100.0)
Platelets: 94 10*3/uL — ABNORMAL LOW (ref 150–400)
RBC: 3.89 MIL/uL (ref 3.87–5.11)
RDW: 14.1 % (ref 11.5–15.5)
WBC: 4.6 10*3/uL (ref 4.0–10.5)
nRBC: 0 % (ref 0.0–0.2)

## 2021-09-10 LAB — HEPATIC FUNCTION PANEL
ALT: 145 U/L — ABNORMAL HIGH (ref 0–44)
AST: 517 U/L — ABNORMAL HIGH (ref 15–41)
Albumin: 3.3 g/dL — ABNORMAL LOW (ref 3.5–5.0)
Alkaline Phosphatase: 48 U/L (ref 38–126)
Bilirubin, Direct: 0.4 mg/dL — ABNORMAL HIGH (ref 0.0–0.2)
Indirect Bilirubin: 0.7 mg/dL (ref 0.3–0.9)
Total Bilirubin: 1.1 mg/dL (ref 0.3–1.2)
Total Protein: 6.8 g/dL (ref 6.5–8.1)

## 2021-09-10 LAB — RENAL FUNCTION PANEL
Albumin: 3.2 g/dL — ABNORMAL LOW (ref 3.5–5.0)
Anion gap: 10 (ref 5–15)
BUN: 7 mg/dL (ref 6–20)
CO2: 25 mmol/L (ref 22–32)
Calcium: 7.2 mg/dL — ABNORMAL LOW (ref 8.9–10.3)
Chloride: 100 mmol/L (ref 98–111)
Creatinine, Ser: 0.56 mg/dL (ref 0.44–1.00)
GFR, Estimated: >60 mL/min (ref >60)
Glucose, Bld: 110 mg/dL — ABNORMAL HIGH (ref 70–99)
Phosphorus: 3.2 mg/dL (ref 2.5–4.6)
Potassium: 3.5 mmol/L (ref 3.5–5.1)
Sodium: 135 mmol/L (ref 135–145)

## 2021-09-10 LAB — URINE CULTURE: Culture: NO GROWTH

## 2021-09-10 LAB — GLUCOSE, CAPILLARY
Glucose-Capillary: 105 mg/dL — ABNORMAL HIGH (ref 70–99)
Glucose-Capillary: 124 mg/dL — ABNORMAL HIGH (ref 70–99)
Glucose-Capillary: 124 mg/dL — ABNORMAL HIGH (ref 70–99)
Glucose-Capillary: 127 mg/dL — ABNORMAL HIGH (ref 70–99)
Glucose-Capillary: 132 mg/dL — ABNORMAL HIGH (ref 70–99)
Glucose-Capillary: 154 mg/dL — ABNORMAL HIGH (ref 70–99)
Glucose-Capillary: 92 mg/dL (ref 70–99)

## 2021-09-10 LAB — MRSA NEXT GEN BY PCR, NASAL: MRSA by PCR Next Gen: NOT DETECTED

## 2021-09-10 LAB — LIPASE, BLOOD: Lipase: 686 U/L — ABNORMAL HIGH (ref 11–51)

## 2021-09-10 LAB — MAGNESIUM: Magnesium: 1.2 mg/dL — ABNORMAL LOW (ref 1.7–2.4)

## 2021-09-10 LAB — PROCALCITONIN: Procalcitonin: 0.56 ng/mL

## 2021-09-10 MED ORDER — ALBUTEROL SULFATE (2.5 MG/3ML) 0.083% IN NEBU
2.5000 mg | INHALATION_SOLUTION | RESPIRATORY_TRACT | Status: DC
  Administered 2021-09-11 – 2021-09-12 (×3): 2.5 mg via RESPIRATORY_TRACT
  Filled 2021-09-10 (×4): qty 3

## 2021-09-10 MED ORDER — FUROSEMIDE 10 MG/ML IJ SOLN
40.0000 mg | Freq: Once | INTRAMUSCULAR | Status: AC
  Administered 2021-09-10: 40 mg via INTRAVENOUS
  Filled 2021-09-10: qty 4

## 2021-09-10 MED ORDER — MAGNESIUM SULFATE 2 GM/50ML IV SOLN
2.0000 g | Freq: Once | INTRAVENOUS | Status: AC
  Administered 2021-09-10: 2 g via INTRAVENOUS
  Filled 2021-09-10: qty 50

## 2021-09-10 MED ORDER — MAGNESIUM SULFATE 4 GM/100ML IV SOLN
4.0000 g | Freq: Once | INTRAVENOUS | Status: AC
  Administered 2021-09-10: 4 g via INTRAVENOUS
  Filled 2021-09-10: qty 100

## 2021-09-10 MED ORDER — PHENOBARBITAL 64.8 MG PO TABS
64.8000 mg | ORAL_TABLET | Freq: Three times a day (TID) | ORAL | Status: DC
  Filled 2021-09-10: qty 1

## 2021-09-10 MED ORDER — PHENOBARBITAL 32.4 MG PO TABS: 32.4000 mg | ORAL_TABLET | Freq: Three times a day (TID) | ORAL | Status: DC

## 2021-09-10 MED ORDER — PHENOBARBITAL 97.2 MG PO TABS
97.2000 mg | ORAL_TABLET | Freq: Three times a day (TID) | ORAL | Status: AC
  Administered 2021-09-10 – 2021-09-12 (×6): 97.2 mg via ORAL
  Filled 2021-09-10 (×6): qty 1

## 2021-09-10 MED ORDER — LORAZEPAM 2 MG/ML IJ SOLN: 1.0000 mg | INTRAMUSCULAR | Status: DC | PRN

## 2021-09-10 MED ORDER — IPRATROPIUM-ALBUTEROL 0.5-2.5 (3) MG/3ML IN SOLN: 3.0000 mL | Freq: Four times a day (QID) | RESPIRATORY_TRACT | Status: DC

## 2021-09-10 MED ORDER — IPRATROPIUM BROMIDE 0.02 % IN SOLN
0.5000 mg | RESPIRATORY_TRACT | Status: DC
Start: 2021-09-11 — End: 2021-09-12
  Administered 2021-09-11 – 2021-09-12 (×3): 0.5 mg via RESPIRATORY_TRACT
  Filled 2021-09-10 (×4): qty 2.5

## 2021-09-10 MED ORDER — ORAL CARE MOUTH RINSE
15.0000 mL | Freq: Two times a day (BID) | OROMUCOSAL | Status: DC
  Administered 2021-09-10 – 2021-09-14 (×9): 15 mL via OROMUCOSAL

## 2021-09-10 MED ORDER — POTASSIUM CHLORIDE CRYS ER 20 MEQ PO TBCR
40.0000 meq | EXTENDED_RELEASE_TABLET | Freq: Once | ORAL | Status: AC
  Administered 2021-09-10: 40 meq via ORAL
  Filled 2021-09-10: qty 2

## 2021-09-10 MED ORDER — IPRATROPIUM BROMIDE HFA 17 MCG/ACT IN AERS
2.0000 | INHALATION_SPRAY | RESPIRATORY_TRACT | Status: DC
Start: 2021-09-11 — End: 2021-09-10

## 2021-09-10 NOTE — Progress Notes (Signed)
? ?NAME:  Erika Kelley, MRN:  [id number], DOB:  [birth year], LOS: 1 ?ADMISSION DATE:  09/09/2021, CONSULTATION DATE:  4/5 ?REFERRING MD:  Zammitt/ EDP, CHIEF COMPLAINT:  AMS / hypothermia  ? ?History of Present Illness:  ?40 yobf smoker with h/o heavy etoh use found unresponsive this am and brought to [facility] ER with findings of pin point pupils and low blood sugar, hypothermia, combined metabolic and resp acidosis, high Etoh levels and pos drug screen for cocaine and THC.  After narcan she woke up and gave hx of just drinking vodka all day, no food, and denied any unusual exposures to ASA or products containing Ethylene glycol or methanol .  C/o mild sob p ? Aspirated on arrival though initial cxr ok. ? ?PCCM asked to eval for icu admit here vs [facility] and because of lack of nursing personnel /bed in ICU rec is to transfer to [facility] for careful observation with possible intubation over next 24 h ? ?  ? ?Significant Hospital Events: ?Including procedures, antibiotic start and stop dates in addition to other pertinent events   ?CT head 4/5  atrophy with No acute intracranial abnormalities. Transferred from [facility] to [facility] ICU  ?4/6 back in NSR. Awake on 0.5 narcan gtt  ? ?Interim History / Subjective:  ? NAEO ? ?In NSR ? ?Lipase rising. LFTs elevated ?Cr better ?LA down trending ? ?Objective   ?Blood pressure (!) 138/98, pulse 97, temperature 98.2 ?F (36.8 ?C), temperature source Oral, resp. rate 20, height 5\' 6"  (1.676 m), weight 77.1 kg, SpO2 (!) 87 %. ?   ?FiO2 (%):  [40 %-100 %] 40 %  ? ?Intake/Output Summary (Last 24 hours) at 09/10/2021 0916 ?Last data filed at 09/10/2021 0800 ?Gross per 24 hour  ?Intake 3216.62 ml  ?Output 5025 ml  ?Net -1808.38 ml  ? ?Filed Weights  ? 09/09/21 [id number] 09/09/21 0834  ?Weight: 77.1 kg 77.1 kg  ? ? ?Examination: ?General-WDWN middle aged F NAD  ?Neuro- AAOx3 following commands ?HEENT- NCAT pink mm anicteric sclera ?CV- rrr s1s2 cap refill brisk  ?Pulm- bilateral crackles. Shallow. Diminished  bibasilar sounds  ?GI- soft ndnt + bowel sounds  ?GU-foley yellow urine ?MSK- no acute joint deformity ?Skin- c/d/w ? ?Resolved Hospital Problem list   ? Afib RVR  ? ?Assessment & Plan:  ? ?Acute metabolic encephalopathy, improving ?P ?-wean off narcan gtt  ? ?Acute hypoxic respiratory failure ?Pulmonary edema  ?Suspected aspiration PNA  ?P ?-diurese ?-unasyn  ?-wean O2 as able  ?-IS, pulm hygiene, mobility  ? ?Alcohol abuse ?Polysubstance abuse (THC, cocaine)  ?-drinks >1 bottle liquor/d.  ?P ?-phenobarb ?-Bvits  ? ?Alcoholic pancreatitis  ?Transaminitis ?-suspect etoh related liver dz  ?-minimal abd discomfort ?P ?-supportive care  ?-adv diet as tolerated  ?-PRN analgesia  ? ?Lactic acidosis, improving ?P ?-supportive care  ? ?Hypomagnesemia ?Hypokalemia  ?P ?-replace, trend  ? ?Best Practice (right click and "Reselect all SmartList Selections" daily)  ? ?Diet/type: NPO ?DVT prophylaxis: SCD ?GI prophylaxis: PPI ?Lines: N/A ?Foley:  N/A ?Code Status:  full code ?Last date of multidisciplinary goals of care discussion pending  ? ?Labs   ?CBC: ?Recent Labs  ?Lab 09/09/21 ?[id number] 09/10/21 ?0226  ?WBC 9.5 4.6  ?NEUTROABS 7.8*  --   ?HGB 13.7 11.9*  ?HCT 45.1 34.9*  ?MCV 98.5 89.7  ?PLT 148* 94*  ? ? ?Basic Metabolic Panel: ?Recent Labs  ?Lab 09/09/21 ?0828 09/09/21 ?1032 09/09/21 ?1621 09/10/21 ?0226  ?NA 145  --  138 135  ?  K 3.1*  --  3.0* 3.5  ?CL 100  --  109 100  ?CO2 20*  --  22 25  ?GLUCOSE 36*  --  133* 110*  ?BUN 11  --  10 7  ?CREATININE 2.08*  --  1.05* 0.56  ?CALCIUM 8.3*  --  6.5* 7.2*  ?MG  --  2.1  --  1.2*  ?PHOS  --   --   --  3.2  ? ?GFR: ?Estimated Creatinine Clearance: 90.1 mL/min (by C-G formula based on SCr of 0.56 mg/dL). ?Recent Labs  ?Lab 09/09/21 ?0828 09/09/21 ?1032 09/09/21 ?1614 09/10/21 ?0226  ?PROCALCITON  --  0.38  --  0.56  ?WBC 9.5  --   --  4.6  ?LATICACIDVEN >9.0* >9.0* 3.4*  --   ? ? ?Liver Function Tests: ?Recent Labs  ?Lab 09/09/21 ?[id number] 09/10/21 ?0226  ?AST 494* 517*  ?ALT 150*  145*  ?ALKPHOS 99 48  ?BILITOT 1.1 1.1  ?PROT 8.2* 6.8  ?ALBUMIN 4.0 3.3*  3.2*  ? ?Recent Labs  ?Lab 09/09/21 ?[id number] 09/10/21 ?0226  ?LIPASE 304* 686*  ?AMYLASE 208*  --   ? ?Recent Labs  ?Lab 09/09/21 ?1614  ?AMMONIA 53*  ? ? ?ABG ?   ?Component Value Date/Time  ? PHART 7.18 (LL) 09/09/2021 1147  ? PCO2ART 50 (H) 09/09/2021 1147  ? PO2ART 107 09/09/2021 1147  ? HCO3 19.2 (L) 09/09/2021 1147  ? ACIDBASEDEF 10.0 (H) 09/09/2021 1147  ? O2SAT 99.9 09/09/2021 1147  ?  ? ?Coagulation Profile: ?Recent Labs  ?Lab 09/09/21 ?0828  ?INR 1.1  ? ? ?Cardiac Enzymes: ?No results for input(s): CKTOTAL, CKMB, CKMBINDEX, TROPONINI in the last 168 hours. ? ?HbA1C: ?No results found for: HGBA1C ? ?CBG: ?Recent Labs  ?Lab 09/09/21 ?1620 09/09/21 ?2057 09/10/21 ?[id number] 09/10/21 ?[id number] 09/10/21 ?[id number]  ?GLUCAP 134* [place] ? ? CRITICAL CARE ?Performed by: Cristal Generous ? ? ?Total critical care time: 40 minutes ? ?Critical care time was exclusive of separately billable procedures and treating other patients. ?Critical care was necessary to treat or prevent imminent or life-threatening deterioration. ? ?Critical care was time spent personally by me on the following activities: development of treatment plan with patient and/or surrogate as well as nursing, discussions with consultants, evaluation of patient's response to treatment, examination of patient, obtaining history from patient or surrogate, ordering and performing treatments and interventions, ordering and review of laboratory studies, ordering and review of radiographic studies, pulse oximetry and re-evaluation of patient's condition. ? ?Eliseo Gum MSN, AGACNP-BC ?[facility] ?Amion for pager  ?09/10/2021, 9:16 AM ? ?  ? ? ? ? ?

## 2021-09-10 NOTE — Progress Notes (Signed)
[facility] ADULT ICU REPLACEMENT PROTOCOL ? ? ?The patient does apply for the [facility] Adult ICU Electrolyte Replacment Protocol based on the criteria listed below:  ? ?1.Exclusion criteria: TCTS patients, ECMO patients, and Dialysis patients ?2. Is GFR >/= 30 ml/min? Yes.    ?Patient's GFR today is >60 ?3. Is SCr </= 2? Yes.   ?Patient's SCr is 0.56 mg/dL ?4. Did SCr increase >/= 0.5 in 24 hours? No. ?5.Pt's weight >40kg  Yes.   ?6. Abnormal electrolyte(s): K+ 3.5, Mg 1.2  ?7. Electrolytes replaced per protocol ?8.  Call MD STAT for K+ </= 2.5, Phos </= 1, or Mag </= 1 ?Physician:  Oletta Darter ? ?Margaret Pyle 09/10/2021 4:12 AM  ?

## 2021-09-10 NOTE — Progress Notes (Signed)
eLink Physician-Brief Progress Note ?Patient Name: Erika Kelley ?DOB: 07-Jun-1971 ?MRN: [id number] ? ? ?Date of Service ? 09/10/2021  ?HPI/Events of Note ? Duonebs changed Albuterol and atrovent q4h due to shortage ?  ?eICU Interventions ?   ? ? ? ?Intervention Category ?Minor Interventions: Routine modifications to care plan (e.g. PRN medications for pain, fever) ? ?Lynetta Tomczak Rodman Pickle ?09/10/2021, 11:29 PM ?

## 2021-09-11 ENCOUNTER — Inpatient Hospital Stay (HOSPITAL_COMMUNITY): Payer: Medicaid Other

## 2021-09-11 DIAGNOSIS — J9601 Acute respiratory failure with hypoxia: Secondary | ICD-10-CM

## 2021-09-11 LAB — COMPREHENSIVE METABOLIC PANEL
ALT: 119 U/L — ABNORMAL HIGH (ref 0–44)
AST: 188 U/L — ABNORMAL HIGH (ref 15–41)
Albumin: 3.4 g/dL — ABNORMAL LOW (ref 3.5–5.0)
Alkaline Phosphatase: 55 U/L (ref 38–126)
Anion gap: 8 (ref 5–15)
BUN: 6 mg/dL (ref 6–20)
CO2: 31 mmol/L (ref 22–32)
Calcium: 8 mg/dL — ABNORMAL LOW (ref 8.9–10.3)
Chloride: 94 mmol/L — ABNORMAL LOW (ref 98–111)
Creatinine, Ser: 0.45 mg/dL (ref 0.44–1.00)
GFR, Estimated: >60 mL/min (ref >60)
Glucose, Bld: 102 mg/dL — ABNORMAL HIGH (ref 70–99)
Potassium: 3.1 mmol/L — ABNORMAL LOW (ref 3.5–5.1)
Sodium: 133 mmol/L — ABNORMAL LOW (ref 135–145)
Total Bilirubin: 1.2 mg/dL (ref 0.3–1.2)
Total Protein: 7.4 g/dL (ref 6.5–8.1)

## 2021-09-11 LAB — CBC
HCT: 38.6 % (ref 36.0–46.0)
Hemoglobin: 13.1 g/dL (ref 12.0–15.0)
MCH: 30 pg (ref 26.0–34.0)
MCHC: 33.9 g/dL (ref 30.0–36.0)
MCV: 88.5 fL (ref 80.0–100.0)
Platelets: 92 10*3/uL — ABNORMAL LOW (ref 150–400)
RBC: 4.36 MIL/uL (ref 3.87–5.11)
RDW: 13.8 % (ref 11.5–15.5)
WBC: 6.4 10*3/uL (ref 4.0–10.5)
nRBC: 0 % (ref 0.0–0.2)

## 2021-09-11 LAB — GLUCOSE, CAPILLARY
Glucose-Capillary: 94 mg/dL (ref 70–99)
Glucose-Capillary: 131 mg/dL — ABNORMAL HIGH (ref 70–99)
Glucose-Capillary: 124 mg/dL — ABNORMAL HIGH (ref 70–99)
Glucose-Capillary: 191 mg/dL — ABNORMAL HIGH (ref 70–99)

## 2021-09-11 LAB — HEPATITIS PANEL, ACUTE
HCV Ab: NONREACTIVE
Hep A IgM: NONREACTIVE
Hep B C IgM: NONREACTIVE
Hepatitis B Surface Ag: NONREACTIVE

## 2021-09-11 LAB — MAGNESIUM: Magnesium: 2.3 mg/dL (ref 1.7–2.4)

## 2021-09-11 LAB — PHOSPHORUS: Phosphorus: 1.8 mg/dL — ABNORMAL LOW (ref 2.5–4.6)

## 2021-09-11 MED ORDER — PANTOPRAZOLE SODIUM 40 MG PO TBEC
40.0000 mg | DELAYED_RELEASE_TABLET | Freq: Every day | ORAL | Status: DC
  Administered 2021-09-11 – 2021-09-14 (×4): 40 mg via ORAL
  Filled 2021-09-11 (×4): qty 1

## 2021-09-11 MED ORDER — MAGNESIUM SULFATE 2 GM/50ML IV SOLN
2.0000 g | Freq: Once | INTRAVENOUS | Status: AC
  Administered 2021-09-11: 2 g via INTRAVENOUS
  Filled 2021-09-11: qty 50

## 2021-09-11 MED ORDER — POTASSIUM CHLORIDE CRYS ER 20 MEQ PO TBCR
20.0000 meq | EXTENDED_RELEASE_TABLET | Freq: Once | ORAL | Status: AC
  Administered 2021-09-11: 20 meq via ORAL
  Filled 2021-09-11: qty 1

## 2021-09-11 MED ORDER — FUROSEMIDE 10 MG/ML IJ SOLN
40.0000 mg | Freq: Two times a day (BID) | INTRAMUSCULAR | Status: AC
Start: 2021-09-11 — End: 2021-09-11
  Administered 2021-09-11 (×2): 40 mg via INTRAVENOUS
  Filled 2021-09-11 (×2): qty 4

## 2021-09-11 MED ORDER — POTASSIUM PHOSPHATES 15 MMOLE/5ML IV SOLN
30.0000 mmol | Freq: Once | INTRAVENOUS | Status: AC
  Administered 2021-09-11: 30 mmol via INTRAVENOUS
  Filled 2021-09-11: qty 10

## 2021-09-11 NOTE — Plan of Care (Signed)

## 2021-09-11 NOTE — Progress Notes (Signed)
[facility] ADULT ICU REPLACEMENT PROTOCOL ? ? ?The patient does apply for the [facility] Adult ICU Electrolyte Replacment Protocol based on the criteria listed below:  ? ?1.Exclusion criteria: TCTS patients, ECMO patients, and Dialysis patients ?2. Is GFR >/= 30 ml/min? Yes.    ?Patient's GFR today is >60 ?3. Is SCr </= 2? Yes.   ?Patient's SCr is 0.45 mg/dL ?4. Did SCr increase >/= 0.5 in 24 hours? No. ?5.Pt's weight >40kg  Yes.   ?6. Abnormal electrolyte(s): Phos 1.8, K+ 3.1  ?7. Electrolytes replaced per protocol ?8.  Call MD STAT for K+ </= 2.5, Phos </= 1, or Mag </= 1 ?Physician:  Everardo All  ? ?Genelle Bal 09/11/2021 4:20 AM  ?

## 2021-09-11 NOTE — Progress Notes (Signed)
PHARMACIST - PHYSICIAN COMMUNICATION ? ?DR:   Montez Morita ? ?CONCERNING: IV to Oral Route Change Policy ? ?RECOMMENDATION: ?This patient is receiving pantoprazole by the intravenous route.  Based on criteria approved by the Pharmacy and Therapeutics Committee, the intravenous medication(s) is/are being converted to the equivalent oral dose form(s). ? ? ?DESCRIPTION: ?These criteria include: ?The patient is eating (either orally or via tube) and/or has been taking other orally administered medications for a least 24 hours ?The patient has no evidence of active gastrointestinal bleeding or impaired GI absorption (gastrectomy, short bowel, patient on TNA or NPO). ? ?If you have questions about this conversion, please contact the Pharmacy Department  ?[]   [phone] )  [facility] ?[]   [phone] )  [facility] ?[]   [phone] )  [facility] ?[]   [phone] )  [facility] ?[x]   [phone] )  [facility]  ? ?Royetta Asal, PharmD, BCPS ?Clinical Pharmacist ?[place] ?Please utilize Amion for appropriate phone number to reach the unit pharmacist ([place]) ?09/11/2021 9:40 AM ? ?

## 2021-09-11 NOTE — Progress Notes (Signed)
eLink Physician-Brief Progress Note ?Patient Name: Erika Kelley ?DOB: 05/02/1971 ?MRN: [id number] ? ? ?Date of Service ? 09/11/2021  ?HPI/Events of Note ? RN reports patient had witnessed fall (multiple staff during huddle) when getting of the bed. The fall was slow and resulted in hitting her right posterior part of her head. Prior to fall mental status with mild confusion but AAO x3, unchanged. Awake, alert and following commands ? ?INR 1.1. No antiplatelets or anticoagulation on review of med list. ?  ?eICU Interventions ? Bed alarm failed to trigger. Staff to contact maintenance team. Sensitivity was increased and now working.  ? ?No indication for CT head at this time ?Continue to monitor neuro status ? ?  ? ? ? ?Intervention Category ?Major Interventions: Change in mental status - evaluation and management ? ?[name] [name] ?09/11/2021, 7:09 PM ?

## 2021-09-11 NOTE — Progress Notes (Signed)
? ?NAME:  Erika Kelley, MRN:  [id number], DOB:  04/24/1971, LOS: 2 ?ADMISSION DATE:  09/09/2021, CONSULTATION DATE:  4/5 ?REFERRING MD:  Zammitt/ EDP, CHIEF COMPLAINT:  AMS / hypothermia  ? ?History of Present Illness:  ?23 yobf smoker with h/o heavy etoh use found unresponsive this am and brought to [facility] ER with findings of pin point pupils and low blood sugar, hypothermia, combined metabolic and resp acidosis, high Etoh levels and pos drug screen for cocaine and THC.  After narcan she woke up and gave hx of just drinking vodka all day, no food, and denied any unusual exposures to ASA or products containing Ethylene glycol or methanol .  C/o mild sob p ? Aspirated on arrival though initial cxr ok. ? ?PCCM asked to eval for icu admit here vs [facility] and because of lack of nursing personnel /bed in ICU rec is to transfer to [facility] for careful observation with possible intubation over next 24 h ? ? ?Significant Hospital Events: ?Including procedures, antibiotic start and stop dates in addition to other pertinent events   ?CT head 4/5  atrophy with No acute intracranial abnormalities. Transferred from [facility] to [facility] ICU  ?4/6 back in NSR. Awake on 0.5 narcan gtt ?4/7 off narcan gtt. LFTs down trending. Adv diet   ? ?Interim History / Subjective:  ? ?Off narcan gtt  ? ?On HFNC 65 % 30 L/min but SpO2 95%  ? ?Objective   ?Blood pressure (!) 119/91, pulse 85, temperature 98.5 ?F (36.9 ?C), temperature source Oral, resp. rate 13, height 5\' 6"  (1.676 m), weight 77.1 kg, SpO2 94 %. ?   ?FiO2 (%):  [60 %-70 %] 65 %  ? ?Intake/Output Summary (Last 24 hours) at 09/11/2021 0945 ?Last data filed at 09/11/2021 0900 ?Gross per 24 hour  ?Intake 966.25 ml  ?Output 1500 ml  ?Net -533.75 ml  ? ?Filed Weights  ? 09/09/21 11/09/21 09/09/21 0834  ?Weight: 77.1 kg 77.1 kg  ? ? ?Examination: ?General- WDWN middle aged F side lying in bed NAD  ?Neuro- AAOx4 following commands  ?HEENT- NCAT pink mm HFNC in place anicteric sclera  ?CV- rrr s1s2 no rgm  ?Pulm-  Bilateral expiratory wheeze. + crackles  ?GI-soft ndnt + bowel sounds  ?GU-no foley  ?MSK- no acute joint deformity no cyanosis or clubbing  ?Skin- c/d/w  ? ?Resolved Hospital Problem list   ? Afib RVR  ?Acute metabolic encephalopathy  ? ?Assessment & Plan:  ? ?Acute hypoxic respiratory failure ?Suspected aspiration PNA ?Pulmonary edema  ?Emphysema ?Tobacco abuse  ?P ?-wean O2 as able ?-PT, mobility, IS ?-cont unasyn ?-Cont diuresis -- will try BID lasix 4/7  ?-bronchodilators  ? ?EtOH abuse ?Polysubstance abuse (THC, cocaine)  ?-drinks >1 bottle liquor/d.  ?P ?-cont phenobarb protocol ?-Bvits, multivit ?-TOC consult for cessation resources  ? ?Transaminitis ?Fatty liver  ?Alcoholic pancreatitis  ?-asymptomatic. Tolerating clears well without any pain ?-suspect etoh related liver dz  ?-LFTs starting to down trend  ?-RUQ 6/7 was without gallstones, biliary dilatation ?P ?-adv diet as tolerated to regular diet ?-supportive care ?-trend LFTs, lipase  ? ?Hypokalemia ?Hypophosphatemia  ?P ?-replace K  ? ?Thrombocytopenia ?-plt 148 on 4/5 > 94 on 4/6 to 92 on 4/7 ?-not on heparin. Think this is related to acute organ dysfxn ?P ?-cont SCDs  ?-trend CBC  ? ?Hyperglycemia ?P ?-SSI  ? ?Best Practice (right click and "Reselect all SmartList Selections" daily)  ? ?Diet/type: NPO ?DVT prophylaxis: SCD ?GI prophylaxis: PPI ?Lines: N/A ?Foley:  N/A ?Code Status:  full code ?Last date of multidisciplinary goals of care -- d/w pt 4/6, 4/7 ?Dispo: remains critically ill requiring ICU level of care due to acute hypoxic respiratory failure requiring high O2 support ? ?Labs   ?CBC: ?Recent Labs  ?Lab 09/09/21 ?3419 09/10/21 ?0226 09/11/21 ?0248  ?WBC 9.5 4.6 6.4  ?NEUTROABS 7.8*  --   --   ?HGB 13.7 11.9* 13.1  ?HCT 45.1 34.9* 38.6  ?MCV 98.5 89.7 88.5  ?PLT 148* 94* 92*  ? ? ?Basic Metabolic Panel: ?Recent Labs  ?Lab 09/09/21 ?0828 09/09/21 ?1032 09/09/21 ?1621 09/10/21 ?0226 09/11/21 ?0248  ?NA 145  --  138 135 133*  ?K 3.1*  --  3.0*  3.5 3.1*  ?CL 100  --  109 100 94*  ?CO2 20*  --  22 25 31   ?GLUCOSE 36*  --  133* 110* 102*  ?BUN 11  --  10 7 6   ?CREATININE 2.08*  --  1.05* 0.56 0.45  ?CALCIUM 8.3*  --  6.5* 7.2* 8.0*  ?MG  --  2.1  --  1.2* 2.3  ?PHOS  --   --   --  3.2 1.8*  ? ?GFR: ?Estimated Creatinine Clearance: 90.1 mL/min (by C-G formula based on SCr of 0.45 mg/dL). ?Recent Labs  ?Lab 09/09/21 ?0828 09/09/21 ?1032 09/09/21 ?1614 09/10/21 ?0226 09/11/21 ?0248  ?PROCALCITON  --  0.38  --  0.56  --   ?WBC 9.5  --   --  4.6 6.4  ?LATICACIDVEN >9.0* >9.0* 3.4*  --   --   ? ? ?Liver Function Tests: ?Recent Labs  ?Lab 09/09/21 ?11/11/21 09/10/21 ?0226 09/11/21 ?0248  ?AST 494* 517* 188*  ?ALT 150* 145* 119*  ?ALKPHOS 99 48 55  ?BILITOT 1.1 1.1 1.2  ?PROT 8.2* 6.8 7.4  ?ALBUMIN 4.0 3.3*  3.2* 3.4*  ? ?Recent Labs  ?Lab 09/09/21 ?11/11/21 09/10/21 ?0226  ?LIPASE 304* 686*  ?AMYLASE 208*  --   ? ?Recent Labs  ?Lab 09/09/21 ?1614  ?AMMONIA 53*  ? ? ?ABG ?   ?Component Value Date/Time  ? PHART 7.18 (LL) 09/09/2021 1147  ? PCO2ART 50 (H) 09/09/2021 1147  ? PO2ART 107 09/09/2021 1147  ? HCO3 19.2 (L) 09/09/2021 1147  ? ACIDBASEDEF 10.0 (H) 09/09/2021 1147  ? O2SAT 99.9 09/09/2021 1147  ?  ? ?Coagulation Profile: ?Recent Labs  ?Lab 09/09/21 ?0828  ?INR 1.1  ? ? ?Cardiac Enzymes: ?No results for input(s): CKTOTAL, CKMB, CKMBINDEX, TROPONINI in the last 168 hours. ? ?HbA1C: ?No results found for: HGBA1C ? ?CBG: ?Recent Labs  ?Lab 09/10/21 ?1531 09/10/21 ?1959 09/10/21 ?2351 09/11/21 ?0327 09/11/21 ?0800  ?GLUCAP 154* 92 105* 94 131*  ? ? ?CRITICAL CARE ?Performed by: 11/11/21 ? ? ?Total critical care time: 37 minutes ? ?Critical care time was exclusive of separately billable procedures and treating other patients. ?Critical care was necessary to treat or prevent imminent or life-threatening deterioration. ? ?Critical care was time spent personally by me on the following activities: development of treatment plan with patient and/or surrogate as well as  nursing, discussions with consultants, evaluation of patient's response to treatment, examination of patient, obtaining history from patient or surrogate, ordering and performing treatments and interventions, ordering and review of laboratory studies, ordering and review of radiographic studies, pulse oximetry and re-evaluation of patient's condition. ? ?11/11/21 MSN, AGACNP-BC ?[facility] ?Amion for pager  ?09/11/2021, 9:45 AM ? ? ?  ? ? ? ? ?

## 2021-09-12 LAB — COMPREHENSIVE METABOLIC PANEL
ALT: 86 U/L — ABNORMAL HIGH (ref 0–44)
AST: 90 U/L — ABNORMAL HIGH (ref 15–41)
Albumin: 3.3 g/dL — ABNORMAL LOW (ref 3.5–5.0)
Alkaline Phosphatase: 54 U/L (ref 38–126)
Anion gap: 6 (ref 5–15)
BUN: 7 mg/dL (ref 6–20)
CO2: 33 mmol/L — ABNORMAL HIGH (ref 22–32)
Calcium: 8.2 mg/dL — ABNORMAL LOW (ref 8.9–10.3)
Chloride: 93 mmol/L — ABNORMAL LOW (ref 98–111)
Creatinine, Ser: 0.46 mg/dL (ref 0.44–1.00)
GFR, Estimated: >60 mL/min (ref >60)
Glucose, Bld: 118 mg/dL — ABNORMAL HIGH (ref 70–99)
Potassium: 2.8 mmol/L — ABNORMAL LOW (ref 3.5–5.1)
Sodium: 132 mmol/L — ABNORMAL LOW (ref 135–145)
Total Bilirubin: 1.6 mg/dL — ABNORMAL HIGH (ref 0.3–1.2)
Total Protein: 7.5 g/dL (ref 6.5–8.1)

## 2021-09-12 LAB — CBC
HCT: 40.2 % (ref 36.0–46.0)
Hemoglobin: 14 g/dL (ref 12.0–15.0)
MCH: 30.7 pg (ref 26.0–34.0)
MCHC: 34.8 g/dL (ref 30.0–36.0)
MCV: 88.2 fL (ref 80.0–100.0)
Platelets: 111 10*3/uL — ABNORMAL LOW (ref 150–400)
RBC: 4.56 MIL/uL (ref 3.87–5.11)
RDW: 13.8 % (ref 11.5–15.5)
WBC: 6 10*3/uL (ref 4.0–10.5)
nRBC: 0 % (ref 0.0–0.2)

## 2021-09-12 LAB — GLUCOSE, CAPILLARY
Glucose-Capillary: 111 mg/dL — ABNORMAL HIGH (ref 70–99)
Glucose-Capillary: 133 mg/dL — ABNORMAL HIGH (ref 70–99)
Glucose-Capillary: 225 mg/dL — ABNORMAL HIGH (ref 70–99)
Glucose-Capillary: 145 mg/dL — ABNORMAL HIGH (ref 70–99)

## 2021-09-12 LAB — MAGNESIUM: Magnesium: 2.2 mg/dL (ref 1.7–2.4)

## 2021-09-12 LAB — PHOSPHORUS: Phosphorus: 2.8 mg/dL (ref 2.5–4.6)

## 2021-09-12 MED ORDER — PHENOBARBITAL 32.4 MG PO TABS
64.8000 mg | ORAL_TABLET | Freq: Three times a day (TID) | ORAL | Status: AC
  Administered 2021-09-12 – 2021-09-14 (×6): 64.8 mg via ORAL
  Filled 2021-09-12 (×6): qty 2

## 2021-09-12 MED ORDER — POTASSIUM CHLORIDE CRYS ER 20 MEQ PO TBCR
40.0000 meq | EXTENDED_RELEASE_TABLET | Freq: Once | ORAL | Status: AC
  Administered 2021-09-12: 40 meq via ORAL
  Filled 2021-09-12: qty 2

## 2021-09-12 MED ORDER — IPRATROPIUM BROMIDE 0.02 % IN SOLN
0.5000 mg | RESPIRATORY_TRACT | Status: DC | PRN
Start: 2021-09-12 — End: 2021-09-12

## 2021-09-12 MED ORDER — POTASSIUM CHLORIDE 10 MEQ/100ML IV SOLN
10.0000 meq | INTRAVENOUS | Status: AC
  Administered 2021-09-12 (×4): 10 meq via INTRAVENOUS
  Filled 2021-09-12 (×4): qty 100

## 2021-09-12 MED ORDER — PHENOBARBITAL 32.4 MG PO TABS
32.4000 mg | ORAL_TABLET | Freq: Three times a day (TID) | ORAL | Status: DC
  Administered 2021-09-14: 32.4 mg via ORAL
  Filled 2021-09-12: qty 1

## 2021-09-12 MED ORDER — ALBUTEROL SULFATE (2.5 MG/3ML) 0.083% IN NEBU: 2.5000 mg | INHALATION_SOLUTION | RESPIRATORY_TRACT | Status: DC | PRN

## 2021-09-12 NOTE — Progress Notes (Signed)
[facility] ADULT ICU REPLACEMENT PROTOCOL ? ? ?The patient does apply for the [facility] Adult ICU Electrolyte Replacment Protocol based on the criteria listed below:  ? ?1.Exclusion criteria: TCTS patients, ECMO patients, and Dialysis patients ?2. Is GFR >/= 30 ml/min? Yes.    ?Patient's GFR today is >60 ?3. Is SCr </= 2? Yes.   ?Patient's SCr is 0.46 mg/dL ?4. Did SCr increase >/= 0.5 in 24 hours? No. ?5.Pt's weight >40kg  Yes.   ?6. Abnormal electrolyte(s): K+ 2.8  ?7. Electrolytes replaced per protocol ?8.  Call MD STAT for K+ </= 2.5, Phos </= 1, or Mag </= 1 ?Physician:  Everardo All  ? ?Genelle Bal 09/12/2021 3:51 AM  ?

## 2021-09-12 NOTE — Progress Notes (Signed)
?PROGRESS NOTE ? ? ? ?Erika Kelley  [id number] DOB: [birth year] DOA: 09/09/2021 ?PCP: [facility], [facility]  ?Outpatient Specialists:  ? ? ? ?Brief Narrative:  ?Patient is a 51 year old African-American female with history of alcohol abuse, tobacco abuse, mild further responsive with pinpoint pupils, low blood sugar, hypothermia, combined metabolic and respiratory acidosis, high alcohol levels, pursue screening for cocaine and tetrahydrocannabinol.  Patient woke up after patient was given Narcan.  There are also concerns for possible aspiration pneumonia.  Patient has been transferred to the hospitalist team today. ? ?Patient was seen alongside patient's friend.  Patient is still on significant amount of oxygen supplementation.  Patient is awake and alert, but not particularly a good historian. ? ? ?Assessment & Plan: ?  ?Principal Problem: ?  OD (overdose of drug) ?Active Problems: ?  Hypothermia ?  Puerperal sepsis with acute hypoxic respiratory failure without septic shock ([place]) ?  Acute respiratory failure with hypoxia ([place]) ? ? ?Acute respiratory failure/Acute encephalopathy: ?-Resolved. ?-Likely secondary to opiates. ?-Patient remains on high flow nasal cannula. ?-Continue to treat possible aspiration. ? ?Possible aspiration pneumonia: ?-Continue IV Unasyn. ? ?Alcohol abuse: ?-Continue to monitor closely for withdrawals. ?-Counseled to quit alcohol use. ? ?Polysubstance abuse: ?-Patient tested positive for cocaine and tetrahydrocannabinol. ?-Counseled to quit use. ? ?Transaminitis/fatty liver/alcoholic pancreatitis: ?-Likely secondary to alcohol use. ?-Continue to manage supportively. ? ?Electrolyte abnormalities: ?-Sodium of 132 and potassium of 3.8 earlier today. ?-Continue to monitor and replete. ? ?Thrombocytopenia: ?Acute drop in platelets. ?Likely related to alcohol use/liver disease. ?Continue to monitor closely. ? ? ?DVT prophylaxis: SCD ?Code Status: Full code ?Family Communication:   ?Disposition Plan: Home eventually ? ? ?Consultants:  ?Patient was under ICU team ? ?Procedures:  ?None ? ?Antimicrobials:  ?IV Unasyn ? ? ?Subjective: ?Shortness of breath is improving ?No fever or chills. ?No tremors. ? ?Objective: ?Vitals:  ? 09/12/21 1400 09/12/21 1500 09/12/21 1600 09/12/21 1613  ?BP: (!) 101/59 (!) 150/80    ?Pulse: (!) 114 91  (!) 116  ?Resp: 14 17  20   ?Temp:   98.2 ?F (36.8 ?C)   ?TempSrc:   Oral   ?SpO2: 98% 95%  96%  ?Weight:      ?Height:      ? ? ?Intake/Output Summary (Last 24 hours) at 09/12/2021 1639 ?Last data filed at 09/12/2021 1600 ?Gross per 24 hour  ?Intake 1188.03 ml  ?Output 2350 ml  ?Net -1161.97 ml  ? ?Filed Weights  ? 09/09/21 [id number] 09/09/21 0834  ?Weight: 77.1 kg 77.1 kg  ? ? ?Examination: ? ?General exam: Appears calm and comfortable  ?Respiratory system: Decreased air entry globally. ?Cardiovascular system: S1 & S2 heard ?Gastrointestinal system: Abdomen is soft and nontender.   ?Central nervous system: Alert and oriented.  Moves all extremities.   ?Extremities: No leg edema. ? ?Data Reviewed: I have personally reviewed following labs and imaging studies ? ?CBC: ?Recent Labs  ?Lab 09/09/21 ?[id number] 09/10/21 ?0226 09/11/21 ?0248 09/12/21 ?0243  ?WBC 9.5 4.6 6.4 6.0  ?NEUTROABS 7.8*  --   --   --   ?HGB 13.7 11.9* 13.1 14.0  ?HCT 45.1 34.9* 38.6 40.2  ?MCV 98.5 89.7 88.5 88.2  ?PLT 148* 94* 92* 111*  ? ?Basic Metabolic Panel: ?Recent Labs  ?Lab 09/09/21 ?0828 09/09/21 ?1032 09/09/21 ?1621 09/10/21 ?0226 09/11/21 ?[id number] 09/12/21 ?0243  ?NA 145  --  138 135 133* 132*  ?K 3.1*  --  3.0* 3.5 3.1* 2.8*  ?CL 100  --  109 100  94* 93*  ?CO2 20*  --  22 25 31  33*  ?GLUCOSE 36*  --  133* 110* 102* 118*  ?BUN 11  --  10 7 6 7   ?CREATININE 2.08*  --  1.05* 0.56 0.45 0.46  ?CALCIUM 8.3*  --  6.5* 7.2* 8.0* 8.2*  ?MG  --  2.1  --  1.2* 2.3 2.2  ?PHOS  --   --   --  3.2 1.8* 2.8  ? ?GFR: ?Estimated Creatinine Clearance: 90.1 mL/min (by C-G formula based on SCr of 0.46 mg/dL). ?Liver Function  Tests: ?Recent Labs  ?Lab 09/09/21 ?[id number] 09/10/21 ?0226 09/11/21 ?0248 09/12/21 ?0243  ?AST 494* 517* 188* 90*  ?ALT 150* 145* 119* 86*  ?ALKPHOS 99 48 55 54  ?BILITOT 1.1 1.1 1.2 1.6*  ?PROT 8.2* 6.8 7.4 7.5  ?ALBUMIN 4.0 3.3*  3.2* 3.4* 3.3*  ? ?Recent Labs  ?Lab 09/09/21 ?[id number] 09/10/21 ?0226  ?LIPASE 304* 686*  ?AMYLASE 208*  --   ? ?Recent Labs  ?Lab 09/09/21 ?1614  ?AMMONIA 53*  ? ?Coagulation Profile: ?Recent Labs  ?Lab 09/09/21 ?0828  ?INR 1.1  ? ?Cardiac Enzymes: ?No results for input(s): CKTOTAL, CKMB, CKMBINDEX, TROPONINI in the last 168 hours. ?BNP (last 3 results) ?No results for input(s): PROBNP in the last 8760 hours. ?HbA1C: ?No results for input(s): HGBA1C in the last 72 hours. ?CBG: ?Recent Labs  ?Lab 09/11/21 ?1145 09/11/21 ?1611 09/12/21 ?0744 09/12/21 ?1136 09/12/21 ?1530  ?GLUCAP 124* 191* 111* 133* 225*  ? ?Lipid Profile: ?No results for input(s): CHOL, HDL, LDLCALC, TRIG, CHOLHDL, LDLDIRECT in the last 72 hours. ?Thyroid Function Tests: ?No results for input(s): TSH, T4TOTAL, FREET4, T3FREE, THYROIDAB in the last 72 hours. ?Anemia Panel: ?No results for input(s): VITAMINB12, FOLATE, FERRITIN, TIBC, IRON, RETICCTPCT in the last 72 hours. ?Urine analysis: ?   ?Component Value Date/Time  ? [place] YELLOW 09/09/2021 0828  ? APPEARANCEUR CLEAR 09/09/2021 0828  ? LABSPEC 1.025 09/09/2021 0828  ? PHURINE 6.5 09/09/2021 0828  ? GLUCOSEU 100 (A) 09/09/2021 [id number]  ? HGBUR LARGE (A) 09/09/2021 0828  ? [place] NEGATIVE 09/09/2021 0828  ? [place] NEGATIVE 09/09/2021 0828  ? PROTEINUR 100 (A) 09/09/2021 0828  ? UROBILINOGEN 0.2 03/30/2012 2012  ? NITRITE POSITIVE (A) 09/09/2021 0828  ? LEUKOCYTESUR NEGATIVE 09/09/2021 0828  ? ?Sepsis Labs: ?@[email or web address] (procalcitonin:4,lacticidven:4) ? ?) ?Recent Results (from the past 240 hour(s))  ?Urine Culture     Status: None  ? Collection Time: 09/09/21  8:28 AM  ? Specimen: Urine, Catheterized  ?Result Value Ref Range Status  ? Specimen Description   Final  ?   URINE, CATHETERIZED ?Performed at [facility], [address], [place], [place] [address] ?  ? Special Requests   Final  ?  NONE ?Performed at [facility], [address] [place], [place] [address] ?  ? Culture   Final  ?  NO GROWTH ?Performed at [facility], [place] [address] [place], [place] [address] ?  ? Report Status 09/10/2021 FINAL  Final  ?Blood Culture (routine x 2)     Status: None (Preliminary result)  ? Collection Time: 09/09/21  9:01 AM  ? Specimen: BLOOD  ?Result Value Ref Range Status  ? Specimen Description BLOOD BLOOD RIGHT HAND  Final  ? Special Requests   Final  ?  BOTTLES DRAWN AEROBIC AND ANAEROBIC Blood Culture adequate volume  ? Culture   Final  ?  NO GROWTH 3 DAYS ?Performed at [facility], [address], [place],  [place] [address] ?  ? Report Status PENDING  Incomplete  ?Blood Culture (routine x 2)     Status: None (Preliminary result)  ? Collection Time: 09/09/21  9:09 AM  ? Specimen: BLOOD  ?Result Value Ref Range Status  ? Specimen Description BLOOD BLOOD RIGHT HAND  Final  ? Special Requests   Final  ?  BOTTLES DRAWN AEROBIC AND ANAEROBIC Blood Culture adequate volume  ? Culture   Final  ?  NO GROWTH 3 DAYS ?Performed at [facility], [address] [place], [place] [address] ?  ? Report Status PENDING  Incomplete  ?Resp Panel by RT-PCR (Flu A&B, Covid) Nasopharyngeal Swab     Status: None  ? Collection Time: 09/09/21  1:30 PM  ? Specimen: Nasopharyngeal Swab; Nasopharyngeal(NP) swabs in vial transport medium  ?Result Value Ref Range Status  ? SARS Coronavirus 2 by RT PCR NEGATIVE NEGATIVE Final  ?  Comment: (NOTE) ?SARS-CoV-2 target nucleic acids are NOT DETECTED. ? ?The SARS-CoV-2 RNA is generally detectable in upper respiratory ?specimens during the acute phase of infection. The lowest ?concentration of SARS-CoV-2 viral copies this assay can detect is ?138 copies/mL. A negative result does not preclude SARS-Cov-2 ?infection and should not be used as the sole basis for treatment  or ?other patient management decisions. A negative result may occur with  ?improper specimen collection/handling, submission of specimen other ?than nasopharyngeal swab, presence of viral mutation(s) wi

## 2021-09-12 NOTE — Plan of Care (Signed)

## 2021-09-12 NOTE — Progress Notes (Signed)
Patient was weaned to a 6L SALTER. Patient is tolerating well at this time with no distress. Currently her saturations are maintaining @ 94% and above. RT will continue to monitor ?

## 2021-09-13 DIAGNOSIS — J690 Pneumonitis due to inhalation of food and vomit: Secondary | ICD-10-CM

## 2021-09-13 LAB — GLUCOSE, CAPILLARY
Glucose-Capillary: 114 mg/dL — ABNORMAL HIGH (ref 70–99)
Glucose-Capillary: 114 mg/dL — ABNORMAL HIGH (ref 70–99)
Glucose-Capillary: 134 mg/dL — ABNORMAL HIGH (ref 70–99)
Glucose-Capillary: 155 mg/dL — ABNORMAL HIGH (ref 70–99)
Glucose-Capillary: 163 mg/dL — ABNORMAL HIGH (ref 70–99)
Glucose-Capillary: 183 mg/dL — ABNORMAL HIGH (ref 70–99)

## 2021-09-13 LAB — CBC
HCT: 39.6 % (ref 36.0–46.0)
Hemoglobin: 13 g/dL (ref 12.0–15.0)
MCH: 29.5 pg (ref 26.0–34.0)
MCHC: 32.8 g/dL (ref 30.0–36.0)
MCV: 90 fL (ref 80.0–100.0)
Platelets: 136 10*3/uL — ABNORMAL LOW (ref 150–400)
RBC: 4.4 MIL/uL (ref 3.87–5.11)
RDW: 13.6 % (ref 11.5–15.5)
WBC: 5.5 10*3/uL (ref 4.0–10.5)
nRBC: 0 % (ref 0.0–0.2)

## 2021-09-13 LAB — COMPREHENSIVE METABOLIC PANEL
ALT: 60 U/L — ABNORMAL HIGH (ref 0–44)
AST: 47 U/L — ABNORMAL HIGH (ref 15–41)
Albumin: 3.3 g/dL — ABNORMAL LOW (ref 3.5–5.0)
Alkaline Phosphatase: 58 U/L (ref 38–126)
Anion gap: 8 (ref 5–15)
BUN: 6 mg/dL (ref 6–20)
CO2: 31 mmol/L (ref 22–32)
Calcium: 8.7 mg/dL — ABNORMAL LOW (ref 8.9–10.3)
Chloride: 97 mmol/L — ABNORMAL LOW (ref 98–111)
Creatinine, Ser: 0.41 mg/dL — ABNORMAL LOW (ref 0.44–1.00)
GFR, Estimated: >60 mL/min (ref >60)
Glucose, Bld: 112 mg/dL — ABNORMAL HIGH (ref 70–99)
Potassium: 3.4 mmol/L — ABNORMAL LOW (ref 3.5–5.1)
Sodium: 136 mmol/L (ref 135–145)
Total Bilirubin: 1.4 mg/dL — ABNORMAL HIGH (ref 0.3–1.2)
Total Protein: 7.3 g/dL (ref 6.5–8.1)

## 2021-09-13 LAB — MAGNESIUM: Magnesium: 2.1 mg/dL (ref 1.7–2.4)

## 2021-09-13 LAB — PHOSPHORUS: Phosphorus: 3.1 mg/dL (ref 2.5–4.6)

## 2021-09-13 MED ORDER — FUROSEMIDE 10 MG/ML IJ SOLN
40.0000 mg | Freq: Once | INTRAMUSCULAR | Status: AC
  Administered 2021-09-13: 40 mg via INTRAVENOUS
  Filled 2021-09-13: qty 4

## 2021-09-13 MED ORDER — POTASSIUM CHLORIDE CRYS ER 20 MEQ PO TBCR
40.0000 meq | EXTENDED_RELEASE_TABLET | ORAL | Status: AC
  Administered 2021-09-13 (×2): 40 meq via ORAL
  Filled 2021-09-13 (×2): qty 2

## 2021-09-13 NOTE — Progress Notes (Signed)
?PROGRESS NOTE ? ? ? ?Erika Kelley  IZT:[id number] DOB: 1970/12/21 DOA: 09/09/2021 ?PCP: [facility], [facility]  ?Outpatient Specialists:  ? ? ? ?Brief Narrative:  ?Patient is a 51 year old African-American female with history of alcohol abuse, tobacco abuse, mild further responsive with pinpoint pupils, low blood sugar, hypothermia, combined metabolic and respiratory acidosis, high alcohol levels, pursue screening for cocaine and tetrahydrocannabinol.  Patient woke up after patient was given Narcan.  There are also concerns for possible aspiration pneumonia.  Patient has been transferred to the hospitalist team today. ? ?Patient was seen alongside patient's friend.  Patient is still on significant amount of oxygen supplementation.  Patient is awake and alert, but not particularly a good historian. ? ?09/13/2021: Patient seen.  Patient is slowly improving.  Patient is on 6 L of supplemental oxygen.  We will give IV Lasix 40 Mg x1 dose.  We will repeat chest x-ray in the morning.  Incentive spirometry.  Flutter valve device therapy.  Continue antibiotics.   ? ? ?Assessment & Plan: ?  ?Principal Problem: ?  OD (overdose of drug) ?Active Problems: ?  Hypothermia ?  Puerperal sepsis with acute hypoxic respiratory failure without septic shock (HCC) ?  Acute respiratory failure with hypoxia (HCC) ? ? ?Acute respiratory failure/Acute encephalopathy: ?-Resolved. ?-Likely secondary to opiates. ?-Patient remains on high flow nasal cannula. ?-Continue to treat possible aspiration. ?09/13/2021: See above documentation.  Slowly improving. ? ?Possible aspiration pneumonia: ?-Continue IV Unasyn. ? ?Alcohol abuse: ?-Continue to monitor closely for withdrawals. ?-Counseled to quit alcohol use. ? ?Polysubstance abuse: ?-Patient tested positive for cocaine and tetrahydrocannabinol. ?-Counseled to quit use. ? ?Transaminitis/fatty liver/alcoholic pancreatitis: ?-Likely secondary to alcohol use. ?-AST and ALT continue to  improve. ?-Continue to manage supportively. ? ?Electrolyte abnormalities: ?-Sodium of 132 and potassium of 3.8 earlier today. ?-Continue to monitor and replete. ?09/13/2021: Potassium is 3.4 today.  Will replete.  We will give KCl 40 M EQ x2 doses. ? ?Thrombocytopenia: ?Acute drop in platelets, but slowly improving. ?Likely related to alcohol use/liver disease. ?Continue to monitor closely. ? ? ?DVT prophylaxis: SCD ?Code Status: Full code ?Family Communication:  ?Disposition Plan: Home eventually ? ? ?Consultants:  ?Patient was under ICU team ? ?Procedures:  ?None ? ?Antimicrobials:  ?IV Unasyn ? ? ?Subjective: ?Shortness of breath is improving ?No fever or chills. ?No tremors. ? ?Objective: ?Vitals:  ? 09/12/21 1833 09/12/21 2213 09/13/21 0155 09/13/21 0617  ?BP: 138/84 (!) 124/93 107/86 107/81  ?Pulse: (!) 109 (!) 105 95 92  ?Resp: 19 20 16 20   ?Temp: 99.3 ?F (37.4 ?C) 98.4 ?F (36.9 ?C) 98.9 ?F (37.2 ?C) 98.6 ?F (37 ?C)  ?TempSrc: Oral Oral Oral Oral  ?SpO2: 98% 95% 96% 96%  ?Weight:      ?Height:      ? ? ?Intake/Output Summary (Last 24 hours) at 09/13/2021 1535 ?Last data filed at 09/13/2021 3825 ?Gross per 24 hour  ?Intake 277.53 ml  ?Output 2050 ml  ?Net -1772.47 ml  ? ? ?Filed Weights  ? 09/09/21 0539 09/09/21 0834  ?Weight: 77.1 kg 77.1 kg  ? ? ?Examination: ? ?General exam: Appears calm and comfortable  ?Respiratory system: Clear to auscultation anteriorly. ?Cardiovascular system: S1 & S2 heard ?Gastrointestinal system: Abdomen is soft and nontender.   ?Central nervous system: Alert and oriented.  Moves all extremities.   ?Extremities: No leg edema. ? ?Data Reviewed: I have personally reviewed following labs and imaging studies ? ?CBC: ?Recent Labs  ?Lab 09/09/21 ?7673 09/10/21 ?4193 09/11/21 ?7902  09/12/21 ?[id number] 09/13/21 ?[id number]  ?WBC 9.5 4.6 6.4 6.0 5.5  ?NEUTROABS 7.8*  --   --   --   --   ?HGB 13.7 11.9* 13.1 14.0 13.0  ?HCT 45.1 34.9* 38.6 40.2 39.6  ?MCV 98.5 89.7 88.5 88.2 90.0  ?PLT 148* 94* 92* 111* 136*   ? ? ?Basic Metabolic Panel: ?Recent Labs  ?Lab 09/09/21 ?1032 09/09/21 ?1621 09/10/21 ?[id number] 09/11/21 ?[id number] 09/12/21 ?[id number] 09/13/21 ?[id number]  ?NA  --  138 135 133* 132* 136  ?K  --  3.0* 3.5 3.1* 2.8* 3.4*  ?CL  --  109 100 94* 93* 97*  ?CO2  --  22 25 31  33* 31  ?GLUCOSE  --  133* 110* 102* 118* 112*  ?BUN  --  10 7 6 7 6   ?CREATININE  --  1.05* 0.56 0.45 0.46 0.41*  ?CALCIUM  --  6.5* 7.2* 8.0* 8.2* 8.7*  ?MG 2.1  --  1.2* 2.3 2.2 2.1  ?PHOS  --   --  3.2 1.8* 2.8 3.1  ? ? ?GFR: ?Estimated Creatinine Clearance: 90.1 mL/min (A) (by C-G formula based on SCr of 0.41 mg/dL (L)). ?Liver Function Tests: ?Recent Labs  ?Lab 09/09/21 ?[id number] 09/10/21 ?0226 09/11/21 ?[id number] 09/12/21 ?[id number] 09/13/21 ?[id number]  ?AST 494* 517* 188* 90* 47*  ?ALT 150* 145* 119* 86* 60*  ?ALKPHOS 99 48 55 54 58  ?BILITOT 1.1 1.1 1.2 1.6* 1.4*  ?PROT 8.2* 6.8 7.4 7.5 7.3  ?ALBUMIN 4.0 3.3*  3.2* 3.4* 3.3* 3.3*  ? ? ?Recent Labs  ?Lab 09/09/21 ?[id number] 09/10/21 ?0226  ?LIPASE 304* 686*  ?AMYLASE 208*  --   ? ? ?Recent Labs  ?Lab 09/09/21 ?1614  ?AMMONIA 53*  ? ? ?Coagulation Profile: ?Recent Labs  ?Lab 09/09/21 ?0828  ?INR 1.1  ? ? ?Cardiac Enzymes: ?No results for input(s): CKTOTAL, CKMB, CKMBINDEX, TROPONINI in the last 168 hours. ?BNP (last 3 results) ?No results for input(s): PROBNP in the last 8760 hours. ?HbA1C: ?No results for input(s): HGBA1C in the last 72 hours. ?CBG: ?Recent Labs  ?Lab 09/12/21 ?2010 09/13/21 ?0017 09/13/21 ?0422 09/13/21 ?[id number] 09/13/21 ?1115  ?GLUCAP 145* 134* 163* 155* 114*  ? ? ?Lipid Profile: ?No results for input(s): CHOL, HDL, LDLCALC, TRIG, CHOLHDL, LDLDIRECT in the last 72 hours. ?Thyroid Function Tests: ?No results for input(s): TSH, T4TOTAL, FREET4, T3FREE, THYROIDAB in the last 72 hours. ?Anemia Panel: ?No results for input(s): VITAMINB12, FOLATE, FERRITIN, TIBC, IRON, RETICCTPCT in the last 72 hours. ?Urine analysis: ?   ?Component Value Date/Time  ? [place] YELLOW 09/09/2021 0828  ? APPEARANCEUR CLEAR 09/09/2021 0828   ? LABSPEC 1.025 09/09/2021 0828  ? PHURINE 6.5 09/09/2021 0828  ? GLUCOSEU 100 (A) 09/09/2021 [id number]  ? HGBUR LARGE (A) 09/09/2021 0828  ? [place] NEGATIVE 09/09/2021 0828  ? [place] NEGATIVE 09/09/2021 0828  ? PROTEINUR 100 (A) 09/09/2021 0828  ? UROBILINOGEN 0.2 03/30/2012 2012  ? NITRITE POSITIVE (A) 09/09/2021 0828  ? LEUKOCYTESUR NEGATIVE 09/09/2021 0828  ? ?Sepsis Labs: ?@[email or web address] (procalcitonin:4,lacticidven:4) ? ?) ?Recent Results (from the past 240 hour(s))  ?Urine Culture     Status: None  ? Collection Time: 09/09/21  8:28 AM  ? Specimen: Urine, Catheterized  ?Result Value Ref Range Status  ? Specimen Description   Final  ?  URINE, CATHETERIZED ?Performed at [facility], [address] [place], [place] [address] ?  ? Special Requests   Final  ?  NONE ?Performed at [facility], [address] [place], [place] [address] ?  ?  Culture   Final  ?  NO GROWTH ?Performed at [facility], [place] [address], [place], [place] [address] ?  ? Report Status 09/10/2021 FINAL  Final  ?Blood Culture (routine x 2)     Status: None (Preliminary result)  ? Collection Time: 09/09/21  9:01 AM  ? Specimen: BLOOD  ?Result Value Ref Range Status  ? Specimen Description BLOOD BLOOD RIGHT HAND  Final  ? Special Requests   Final  ?  BOTTLES DRAWN AEROBIC AND ANAEROBIC Blood Culture adequate volume  ? Culture   Final  ?  NO GROWTH 4 DAYS ?Performed at [facility], [address] [place], [place] [address] ?  ? Report Status PENDING  Incomplete  ?Blood Culture (routine x 2)     Status: None (Preliminary result)  ? Collection Time: 09/09/21  9:09 AM  ? Specimen: BLOOD  ?Result Value Ref Range Status  ? Specimen Description BLOOD BLOOD RIGHT HAND  Final  ? Special Requests   Final  ?  BOTTLES DRAWN AEROBIC AND ANAEROBIC Blood Culture adequate volume  ? Culture   Final  ?  NO GROWTH 4 DAYS ?Performed at [facility], [address] [place], [place] [address] ?  ? Report Status PENDING  Incomplete  ?Resp Panel by RT-PCR (Flu  A&B, Covid) Nasopharyngeal Swab     Status: None  ? Collection Time: 09/09/21  1:30 PM  ? Specimen: Nasopharyngeal Swab; Nasopharyngeal(NP) swabs in vial transport medium  ?Result Value Ref Range Status  ? SARS

## 2021-09-13 NOTE — TOC Progression Note (Signed)
Transition of Care (TOC) - Progression Note  ? ? ?Patient Details  ?Name: Erika Kelley ?MRN: [id number] ?Date of Birth: July 13, 1970 ? ?Transition of Care (TOC) CM/SW Contact  ?Cecille Po, RN ?Phone Number: ?09/13/2021, 9:31 AM ? ?Clinical Narrative:    ? ?Patient with a drug overdose and subsequent aspiration.  Substance abuse resources added to the AVS.  Patient uninsured.  Currently weaning O2.  ?TOC following.  ? ? ?Expected Discharge Plan: Home/Self Care ?Barriers to Discharge: Continued Medical Work up ? ?Expected Discharge Plan and Services ?Expected Discharge Plan: Home/Self Care ?  ? S ?

## 2021-09-13 NOTE — Discharge Instructions (Signed)
?  Outpatient Substance Use Treatment Services  ? ?[facility]  ?[facility] ?[address] ?[place], [place] [address]  ?[phone] ?Private insurance, Medicare A&B, and GCCN  ? ?[facility]  ?[address]  ?[place], [place] [address] ?[phone] ?Medicaid, Self Pay  ? ?[facility]      ?[address]  ?[place], [place] ?[phone] ?Medicaid and Private Insurance, Self Pay  ? ?[facility] ?[address]  ?[place], [place]  ?[phone] ?Private Insurance, and Self Pay ? ?[facility]      ?[address]    ?[place], [place] [address]  ?[phone] or [phone] ?Private Insurance Only  ? ? ?[facility] ?[address]  ?[place], [place] [address] ?[phone] ?Medicaid, Medicare, Private Insurance ? ?[facility] ?[address]  ?[place], [place] [address] ?[phone] ?Services are free or reduced ? ?[facility]  ?[address] ?[phone]  ?Self Pay only, sliding scale ? ?[facility]  ?[address]  ?[place], [place] [address] ?[phone] ?([facility]) ?Self Pay, Medicaid Only  ? ?[facility] ?[address]  ?[place], [place] [address] ?[phone] ?Medicaid, Medicare, Private Insurance ? ? ? ? ?Residential Substance Use Treatment Services  ? ?[facility]  ?[address]  ?[place], [place] [address]  ?[phone] or [phone] ?Detox (Medicare, Medicaid, private insurance, and self pay)  ?Residential Rehab 14 days (Medicare, Medicaid, private insurance, and self pay)  ? ?[facility]  ?[address] [place], [place]  ?[phone]  ?Female and Female Detox (Self Pay and Medicaid limited availability)  ?Rehab only Female (Medicaid and self pay only)  ? ?[facility]      ?[address]  ?[place], [place] [address]  ?[phone] or [phone] ?Detox and Residential Treatment ?Private Insurance Only  ? ?[facility]  ?[address]  ?[place], [place] [address]  ?[phone]  ?Treatment Only, must make assessment appointment, and must be sober for assessment appointment.  ?Self Pay Only, Medicare A&B, [place] Medicaid, [place] only! ?*Transportation assistance offered from [employer] [address] ? ?[facility]     ?[address] ?[place], [place] [address] ?Walk in interviews M-Sat 8-4p ?No pending legal charges ?[phone] ? ? ? ? ?[facility]:  [facility] ?Referral  ?[address] ?[place], [place] ?[phone] ?(Self Pay, Medicaid) ? ?[facility] ?[address] ?[place], [place] [address] ?[phone] ?Detox and Residential Treatment ?Medicare and Private Insurance ? ?[facility] ?[address]  ?[place], [place] [address] ?28 Day Women's Facility: [phone] ?28 Day Men's Facility: [phone] ?Long-term Residential Program:  ?[phone] Males 25 and Over ?(No Insurance, upfront fee) ? ?[facility]  ?[address] ?[place], [place] [address] ?[phone] ?Private Insurance with [employer], Private Pay ? ?[facility] ?[address] ?[place], [place] [address] ?Local [phone] ?Private Insurance Only ? ?[facility] ?[address]  ?[place],  [address]  ?[phone] ?(Males, upfront fee) ? ?[facility] ?[address]  ?[place], [address] ?[phone] ?Private Insurance  ?

## 2021-09-13 NOTE — Plan of Care (Signed)
  Problem: Clinical Measurements: Goal: Will remain free from infection Outcome: Progressing Goal: Respiratory complications will improve Outcome: Progressing   Problem: Coping: Goal: Level of anxiety will decrease Outcome: Progressing   Problem: Safety: Goal: Ability to remain free from injury will improve Outcome: Progressing   

## 2021-09-13 NOTE — Progress Notes (Signed)
Pharmacy Antibiotic Note ? ?Erika Kelley is a 51 y.o. female admitted on 09/09/2021 with aspiration pneumonia.  Pharmacy has been consulted for Unasyn dosing. ? ?Plan: ?Continue Unasyn 3g IV q6h ?Stop date in place for 4/10 to complete course ?Dosage remains stable and need for further dosage adjustment appears unlikely at present.  Pharmacy will sign off at this time.  Please reconsult if needed.   ? ? ?Height: 5\' 6"  (167.6 cm) ?Weight: 77.1 kg (170 lb) ?IBW/kg (Calculated) : 59.3 ? ?Temp (24hrs), Avg:98.6 ?F (37 ?C), Min:98.1 ?F (36.7 ?C), Max:99.3 ?F (37.4 ?C) ? ?Recent Labs  ?Lab 09/09/21 ?0828 09/09/21 ?1032 09/09/21 ?1614 09/09/21 ?1621 09/10/21 ?11/10/21 09/11/21 ?11/11/21 09/12/21 ?11/12/21 09/13/21 ?11/13/21  ?WBC 9.5  --   --   --  4.6 6.4 6.0 5.5  ?CREATININE 2.08*  --   --  1.05* 0.56 0.45 0.46 0.41*  ?LATICACIDVEN >9.0* >9.0* 3.4*  --   --   --   --   --   ? ?  ?Estimated Creatinine Clearance: 90.1 mL/min (A) (by C-G formula based on SCr of 0.41 mg/dL (L)).   ? ?No Known Allergies ? ?Antimicrobials this admission: ?4/5 Vanc/Cefepime/Flagyl x 1 ?4/5 Unasyn >> (4/10) ? ?Dose adjustments this admission: ? ?Microbiology results: ?22/5 BCx: ngtd ?4/5 UCx: ngF ?4/5 MRSA PCR: not detected ? ?Thank you for allowing pharmacy to be a part of this patient?s care. ? ?6/11 PharmD, BCPS ?Clinical Pharmacist ?[facility] main pharmacy [phone] ?09/13/2021 11:01 AM ? ?

## 2021-09-13 NOTE — Evaluation (Signed)
Physical Therapy Evaluation ?Patient Details ?Name: Erika Kelley ?MRN: [id number] ?DOB: Mar 29, 1971 ?Today's Date: 09/13/2021 ? ?History of Present Illness ? Pt admitted from home unresponsive and in acute respiratory failure in setting of opiate/etoh and cocaine.  Pt also in a-fib and with ARF  ?Clinical Impression ? Pt admitted as above and presenting with functional mobility limitations 2* mild ambulatory balance deficits.  This date, pt in room with O2 @ 6L and sat at 99%.  Pt up to ambulate 1000' and titrating O2 down thoroughout - pt returning to room walking and talking on RA with no c/o SOB and with sat at 91% - RN aware. ?   ? ?Recommendations for follow up therapy are one component of a multi-disciplinary discharge planning process, led by the attending physician.  Recommendations may be updated based on patient status, additional functional criteria and insurance authorization. ? ?Follow Up Recommendations No PT follow up ? ?  ?Assistance Recommended at Discharge None  ?Patient can return home with the following ?   ? ?  ?Equipment Recommendations None recommended by PT  ?Recommendations for Other Services ?    ?  ?Functional Status Assessment Patient has had a recent decline in their functional status and demonstrates the ability to make significant improvements in function in a reasonable and predictable amount of time.  ? ?  ?Precautions / Restrictions Precautions ?Precautions: Fall ?Restrictions ?Weight Bearing Restrictions: No  ? ?  ? ?Mobility ? Bed Mobility ?Overal bed mobility: Modified Independent ?  ?  ?  ?  ?  ?  ?General bed mobility comments: no physical assist ?  ? ?Transfers ?Overall transfer level: Modified independent ?Equipment used: None ?  ?  ?  ?  ?  ?  ?  ?General transfer comment: no physical assist ?  ? ?Ambulation/Gait ?Ambulation/Gait assistance: Min guard, Supervision ?Gait Distance (Feet): 1000 Feet ?Assistive device: None ?Gait Pattern/deviations: Step-through pattern,  Decreased step length - right, Decreased step length - left, Shuffle, Wide base of support ?Gait velocity: mod pace ?  ?  ?General Gait Details: Mild occasional instability but no LOB; slightly increased BOS with shuffling gait (pt wearing her slides to ambulate) ? ?Stairs ?  ?  ?  ?  ?  ? ?Wheelchair Mobility ?  ? ?Modified Rankin (Stroke Patients Only) ?  ? ?  ? ?Balance Overall balance assessment: Mild deficits observed, not formally tested ?  ?  ?  ?  ?  ?  ?  ?  ?  ?  ?  ?  ?  ?  ?  ?  ?  ?  ?   ? ? ? ?Pertinent Vitals/Pain Pain Assessment ?Pain Assessment: No/denies pain  ? ? ?Home Living Family/patient expects to be discharged to:: Private residence ?Living Arrangements: Parent;Other relatives ?Available Help at Discharge: Family;Available PRN/intermittently ?Type of Home: House ?  ?  ?  ?  ?Home Layout: Able to live on main level with bedroom/bathroom ?Home Equipment: None ?Additional Comments: Pt states she lives with her father to help take care of him and that her "grand kids are around"  ?  ?Prior Function Prior Level of Function : Independent/Modified Independent ?  ?  ?  ?  ?  ?  ?  ?  ?  ? ? ?Hand Dominance  ?   ? ?  ?Extremity/Trunk Assessment  ? Upper Extremity Assessment ?Upper Extremity Assessment: Overall WFL for tasks assessed ?  ? ?Lower Extremity Assessment ?Lower Extremity Assessment: Overall [facility]  for tasks assessed ?  ? ?Cervical / Trunk Assessment ?Cervical / Trunk Assessment: Normal  ?Communication  ? Communication: No difficulties  ?Cognition Arousal/Alertness: Awake/alert ?Behavior During Therapy: [facility] for tasks assessed/performed ?Overall Cognitive Status: Within Functional Limits for tasks assessed ?  ?  ?  ?  ?  ?  ?  ?  ?  ?  ?  ?  ?  ?  ?  ?  ?  ?  ?  ? ?  ?General Comments   ? ?  ?Exercises    ? ?Assessment/Plan  ?  ?PT Assessment Patient needs continued PT services  ?PT Problem List Decreased balance ? ?   ?  ?PT Treatment Interventions DME instruction;Gait training;Stair  training;Therapeutic activities   ? ?PT Goals (Current goals can be found in the Care Plan section)  ?Acute Rehab PT Goals ?Patient Stated Goal: HOME without O2 ?PT Goal Formulation: With patient ?Time For Goal Achievement: 09/27/21 ?Potential to Achieve Goals: Good ? ?  ?Frequency Min 3X/week ?  ? ? ?Co-evaluation   ?  ?  ?  ?  ? ? ?  ?AM-PAC PT "6 Clicks" Mobility  ?Outcome Measure Help needed turning from your back to your side while in a flat bed without using bedrails?: None ?Help needed moving from lying on your back to sitting on the side of a flat bed without using bedrails?: None ?Help needed moving to and from a bed to a chair (including a wheelchair)?: None ?Help needed standing up from a chair using your arms (e.g., wheelchair or bedside chair)?: None ?Help needed to walk in hospital room?: A Little ?Help needed climbing 3-5 steps with a railing? : A Little ?6 Click Score: 22 ? ?  ?End of Session Equipment Utilized During Treatment: Gait belt;Oxygen ?Activity Tolerance: Patient tolerated treatment well ?Patient left: in bed;with call bell/phone within reach;with bed alarm set ?Nurse Communication: Mobility status ?PT Visit Diagnosis: Unsteadiness on feet (R26.81) ?  ? ?Time: 3382-5053 ?PT Time Calculation (min) (ACUTE ONLY): 19 min ? ? ?Charges:   PT Evaluation ?$PT Eval Low Complexity: 1 Low ?  ?  ?   ? ? ?Mauro Kaufmann PT ?Acute Rehabilitation Services ?Pager [phone] ?Office [phone] ? ? ?Erika Kelley ?09/13/2021, 4:20 PM ? ?

## 2021-09-14 ENCOUNTER — Inpatient Hospital Stay (HOSPITAL_COMMUNITY): Payer: Medicaid Other

## 2021-09-14 LAB — COMPREHENSIVE METABOLIC PANEL
ALT: 48 U/L — ABNORMAL HIGH (ref 0–44)
AST: 40 U/L (ref 15–41)
Albumin: 3.4 g/dL — ABNORMAL LOW (ref 3.5–5.0)
Alkaline Phosphatase: 66 U/L (ref 38–126)
Anion gap: 7 (ref 5–15)
BUN: <5 mg/dL — ABNORMAL LOW (ref 6–20)
CO2: 28 mmol/L (ref 22–32)
Calcium: 8.7 mg/dL — ABNORMAL LOW (ref 8.9–10.3)
Chloride: 100 mmol/L (ref 98–111)
Creatinine, Ser: 0.33 mg/dL — ABNORMAL LOW (ref 0.44–1.00)
GFR, Estimated: >60 mL/min (ref >60)
Glucose, Bld: 121 mg/dL — ABNORMAL HIGH (ref 70–99)
Potassium: 4 mmol/L (ref 3.5–5.1)
Sodium: 135 mmol/L (ref 135–145)
Total Bilirubin: 0.6 mg/dL (ref 0.3–1.2)
Total Protein: 7.2 g/dL (ref 6.5–8.1)

## 2021-09-14 LAB — CBC
HCT: 37.9 % (ref 36.0–46.0)
Hemoglobin: 12.8 g/dL (ref 12.0–15.0)
MCH: 30.3 pg (ref 26.0–34.0)
MCHC: 33.8 g/dL (ref 30.0–36.0)
MCV: 89.8 fL (ref 80.0–100.0)
Platelets: 147 10*3/uL — ABNORMAL LOW (ref 150–400)
RBC: 4.22 MIL/uL (ref 3.87–5.11)
RDW: 13.5 % (ref 11.5–15.5)
WBC: 6.3 10*3/uL (ref 4.0–10.5)
nRBC: 0 % (ref 0.0–0.2)

## 2021-09-14 LAB — PHOSPHORUS: Phosphorus: 3.5 mg/dL (ref 2.5–4.6)

## 2021-09-14 LAB — RENAL FUNCTION PANEL
Albumin: 3.3 g/dL — ABNORMAL LOW (ref 3.5–5.0)
Anion gap: 7 (ref 5–15)
BUN: <5 mg/dL — ABNORMAL LOW (ref 6–20)
CO2: 28 mmol/L (ref 22–32)
Calcium: 8.7 mg/dL — ABNORMAL LOW (ref 8.9–10.3)
Chloride: 100 mmol/L (ref 98–111)
Creatinine, Ser: 0.4 mg/dL — ABNORMAL LOW (ref 0.44–1.00)
GFR, Estimated: >60 mL/min (ref >60)
Glucose, Bld: 119 mg/dL — ABNORMAL HIGH (ref 70–99)
Phosphorus: 3.4 mg/dL (ref 2.5–4.6)
Potassium: 4 mmol/L (ref 3.5–5.1)
Sodium: 135 mmol/L (ref 135–145)

## 2021-09-14 LAB — GLUCOSE, CAPILLARY
Glucose-Capillary: 104 mg/dL — ABNORMAL HIGH (ref 70–99)
Glucose-Capillary: 117 mg/dL — ABNORMAL HIGH (ref 70–99)
Glucose-Capillary: 123 mg/dL — ABNORMAL HIGH (ref 70–99)
Glucose-Capillary: 131 mg/dL — ABNORMAL HIGH (ref 70–99)
Glucose-Capillary: 161 mg/dL — ABNORMAL HIGH (ref 70–99)
Glucose-Capillary: 167 mg/dL — ABNORMAL HIGH (ref 70–99)
Glucose-Capillary: 98 mg/dL (ref 70–99)

## 2021-09-14 LAB — CULTURE, BLOOD (ROUTINE X 2)
Culture: NO GROWTH
Culture: NO GROWTH
Special Requests: ADEQUATE
Special Requests: ADEQUATE

## 2021-09-14 LAB — MAGNESIUM: Magnesium: 1.8 mg/dL (ref 1.7–2.4)

## 2021-09-14 MED ORDER — FOLIC ACID 1 MG PO TABS: 1.0000 mg | ORAL_TABLET | Freq: Every day | ORAL | 1 refills | Status: AC

## 2021-09-14 MED ORDER — IPRATROPIUM-ALBUTEROL 20-100 MCG/ACT IN AERS: 2.0000 | INHALATION_SPRAY | Freq: Four times a day (QID) | RESPIRATORY_TRACT | 1 refills | Status: DC | PRN

## 2021-09-14 MED ORDER — ADULT MULTIVITAMIN W/MINERALS CH
1.0000 | ORAL_TABLET | Freq: Every day | ORAL | 1 refills | Status: AC
Start: 2021-09-15 — End: 2021-10-15

## 2021-09-14 MED ORDER — THIAMINE HCL 100 MG PO TABS
100.0000 mg | ORAL_TABLET | Freq: Every day | ORAL | 1 refills | Status: DC
Start: 2021-09-15 — End: 2023-04-29

## 2021-09-14 MED ORDER — NICOTINE 21 MG/24HR TD PT24: 21.0000 mg | MEDICATED_PATCH | Freq: Every day | TRANSDERMAL | 0 refills | Status: DC

## 2021-09-14 MED ORDER — PANTOPRAZOLE SODIUM 40 MG PO TBEC: 40.0000 mg | DELAYED_RELEASE_TABLET | Freq: Every day | ORAL | 1 refills | Status: DC

## 2021-09-14 MED ORDER — PHENOBARBITAL 32.4 MG PO TABS: 32.4000 mg | ORAL_TABLET | Freq: Three times a day (TID) | ORAL | 0 refills | Status: DC

## 2021-09-14 MED ORDER — FUROSEMIDE 10 MG/ML IJ SOLN
40.0000 mg | Freq: Once | INTRAMUSCULAR | Status: AC
  Administered 2021-09-14: 40 mg via INTRAVENOUS
  Filled 2021-09-14: qty 4

## 2021-09-14 NOTE — Discharge Summary (Signed)
Physician Discharge Summary  ?Patient ID: ?Erika Kelley ?MRN: [id number] ?DOB/AGE: [birth year] 51 y.o. ? ?Admit date: 09/09/2021 ?Discharge date: 09/14/2021 ? ?Admission Diagnoses: ? ?Discharge Diagnoses:  ?Principal Problem: ?  OD (overdose of drug) ?Active Problems: ?  Hypothermia ?  Puerperal sepsis with acute hypoxic respiratory failure without septic shock (HCC) ?  Acute respiratory failure with hypoxia (HCC) ?  Aspiration pneumonia (HCC) ? ? ?Discharged Condition: stable ? ?Hospital Course:  Patient is a 51 year old African-American female with history of alcohol abuse, tobacco abuse and polysubstance abuse.  Patient was found unresponsive with pinpoint pupils, low blood sugar, hypothermia, combined metabolic and respiratory acidosis, high alcohol levels, with positive screening for cocaine and tetrahydrocannabinol.  Patient woke up after patient was given Narcan.  Patient was initially admitted to the ICU team.  There were concerns for possible aspiration pneumonia.  Patient was managed with IV Unasyn.  Patient has completed a course of antibiotics.  Patient was transferred to the hospitalist team for further management.  Patient was initially requiring significant amount of supplemental oxygen.  Patient has been weaned off oxygen.  Patient was also diuresed during the hospital stay.  Patient is back to her baseline.  Patient is eager to be discharged back home.  Patient has been counseled to quit illicit drug use.  Patient will follow with a primary care provider within 1 week of discharge.   ? ?Acute respiratory failure/Acute encephalopathy: ?-Resolved. ?-Likely secondary to opiates. ?-Patient has been weaned off of oxygen. ?-Patient has completed treatment for possible aspiration/aspiration pneumonia.   ?  ?Possible aspiration pneumonia: ?-Patient was treated with IV Unasyn.   ?  ?Alcohol abuse: ?-Patient was managed with CIWA protocol. ? ?Polysubstance abuse: ?-Patient tested positive for cocaine and  tetrahydrocannabinol. ?-Counseled to quit use. ? ?Transaminitis/fatty liver/alcoholic pancreatitis: ?-Likely secondary to alcohol use. ?-AST and ALT have continued to trend downwards. ?-PCP to monitor LFTs on discharge.   ? ?Electrolyte abnormalities: ?-Electrolytes were monitored and corrected during the hospital stay.   ?  ?Thrombocytopenia: ?Acute drop in platelets, but slowly improving. ?Likely related to alcohol use/liver disease and acute illness.Marland Kitchen ?Continue to monitor closely. ? ?Consults:  Patient was initially under the ICU team. ? ? ?Discharge Exam: ?Blood pressure 117/85, pulse 96, temperature 98.7 ?F (37.1 ?C), temperature source Oral, resp. rate 18, height 5\' 6"  (1.676 m), weight 77.1 kg, SpO2 98 %. ? ? ?Disposition: Discharge disposition: 01-Home or Self Care ? ? ? ? ? ? ?Discharge Instructions   ? ? Diet - low sodium heart healthy   Complete by: As directed ?  ? Increase activity slowly   Complete by: As directed ?  ? ?  ? ?Allergies as of 09/14/2021   ?No Known Allergies ?  ? ?  ?Medication List  ?  ? ?STOP taking these medications   ? ?diclofenac 75 MG EC tablet ?Commonly known as: VOLTAREN ?  ?hydrochlorothiazide 25 MG tablet ?Commonly known as: HYDRODIURIL ?  ?HYDROcodone-acetaminophen 5-325 MG tablet ?Commonly known as: NORCO/VICODIN ?  ?meclizine 12.5 MG tablet ?Commonly known as: ANTIVERT ?  ?naproxen sodium 220 MG tablet ?Commonly known as: ALEVE ?  ?oxyCODONE-acetaminophen 5-325 MG tablet ?Commonly known as: Percocet ?  ?potassium chloride 10 MEQ tablet ?Commonly known as: KLOR-CON ?  ? ?  ? ?TAKE these medications   ? ?folic acid 1 MG tablet ?Commonly known as: FOLVITE ?Take 1 tablet (1 mg total) by mouth daily. ?Start taking on: September 15, 2021 ?  ?Ipratropium-Albuterol 20-100 MCG/ACT Aers respimat ?  Commonly known as: COMBIVENT ?Inhale 2 puffs into the lungs every 6 (six) hours as needed for wheezing. ?  ?multivitamin with minerals Tabs tablet ?Take 1 tablet by mouth daily. ?Start taking on:  September 15, 2021 ?  ?nicotine 21 mg/24hr patch ?Commonly known as: NICODERM CQ - dosed in mg/24 hours ?Place 1 patch (21 mg total) onto the skin daily. ?Start taking on: September 15, 2021 ?  ?pantoprazole 40 MG tablet ?Commonly known as: PROTONIX ?Take 1 tablet (40 mg total) by mouth daily. ?Start taking on: September 15, 2021 ?  ?PHENobarbital 32.4 MG tablet ?Commonly known as: LUMINAL ?Take 1 tablet (32.4 mg total) by mouth every 8 (eight) hours for 6 doses. ?  ?thiamine 100 MG tablet ?Take 1 tablet (100 mg total) by mouth daily. ?Start taking on: September 15, 2021 ?  ? ?  ? ? ? ?Signed: ?Barnetta Chapel ?09/14/2021, 4:26 PM ? ? ?

## 2021-09-14 NOTE — Plan of Care (Signed)
  Problem: Education: Goal: Knowledge of General Education information will improve Description: Including pain rating scale, medication(s)/side effects and non-pharmacologic comfort measures Outcome: Adequate for Discharge   Problem: Health Behavior/Discharge Planning: Goal: Ability to manage health-related needs will improve Outcome: Adequate for Discharge   Problem: Clinical Measurements: Goal: Ability to maintain clinical measurements within normal limits will improve Outcome: Adequate for Discharge Goal: Will remain free from infection Outcome: Adequate for Discharge Goal: Diagnostic test results will improve Outcome: Adequate for Discharge Goal: Respiratory complications will improve Outcome: Adequate for Discharge Goal: Cardiovascular complication will be avoided Outcome: Adequate for Discharge   Problem: Activity: Goal: Risk for activity intolerance will decrease Outcome: Adequate for Discharge   Problem: Nutrition: Goal: Adequate nutrition will be maintained Outcome: Adequate for Discharge   Problem: Coping: Goal: Level of anxiety will decrease Outcome: Adequate for Discharge   Problem: Elimination: Goal: Will not experience complications related to bowel motility Outcome: Adequate for Discharge Goal: Will not experience complications related to urinary retention Outcome: Adequate for Discharge   Problem: Pain Managment: Goal: General experience of comfort will improve Outcome: Adequate for Discharge   Problem: Safety: Goal: Ability to remain free from injury will improve Outcome: Adequate for Discharge   Problem: Skin Integrity: Goal: Risk for impaired skin integrity will decrease Outcome: Adequate for Discharge   Problem: Education: Goal: Knowledge of disease or condition will improve Outcome: Adequate for Discharge Goal: Understanding of discharge needs will improve Outcome: Adequate for Discharge   Problem: Health Behavior/Discharge  Planning: Goal: Ability to identify changes in lifestyle to reduce recurrence of condition will improve Outcome: Adequate for Discharge Goal: Identification of resources available to assist in meeting health care needs will improve Outcome: Adequate for Discharge   Problem: Physical Regulation: Goal: Complications related to the disease process, condition or treatment will be avoided or minimized Outcome: Adequate for Discharge   Problem: Safety: Goal: Ability to remain free from injury will improve Outcome: Adequate for Discharge   

## 2021-09-14 NOTE — Progress Notes (Signed)
Physical Therapy Treatment ?Patient Details ?Name: Erika Kelley ?MRN: [id number] ?DOB: 06-Aug-1970 ?Today's Date: 09/14/2021 ? ? ?History of Present Illness Pt admitted from home unresponsive and in acute respiratory failure in setting of opiate/etoh and cocaine.  Pt also in a-fib and with ARF ? ?  ?PT Comments  ? ? Pt ambulated in the hallway with MOD I on RA with o2 97%. She demonstrated higher level dynamic activities without LOB.  Will d/c from PT with goal met.   ?Recommendations for follow up therapy are one component of a multi-disciplinary discharge planning process, led by the attending physician.  Recommendations may be updated based on patient status, additional functional criteria and insurance authorization. ? ?Follow Up Recommendations ? No PT follow up ?  ?  ?Assistance Recommended at Discharge None  ?Patient can return home with the following   ?  ?Equipment Recommendations ? None recommended by PT  ?  ?Recommendations for Other Services   ? ? ?  ?Precautions / Restrictions Precautions ?Precautions: Fall ?Restrictions ?Weight Bearing Restrictions: No  ?  ? ?Mobility ? Bed Mobility ?Overal bed mobility: Independent ?  ?  ?  ?  ?  ?  ?  ?  ? ?Transfers ?Overall transfer level: Independent ?  ?  ?  ?  ?  ?  ?  ?  ?General transfer comment: has been getting up in the room I'ly. ?  ? ?Ambulation/Gait ?Ambulation/Gait assistance: Modified independent (Device/Increase time) ?Gait Distance (Feet): 700 Feet ?Assistive device: None ?Gait Pattern/deviations: Step-through pattern, Decreased step length - right, Decreased step length - left, Shuffle, Wide base of support ?  ?  ?  ?General Gait Details: Patient ambulated two laps with head nods, head turns, quick stops with no LOB and o2 at 97%. In room she demonstrated ability to pick up item off of the floor as well as perform SLS while maneuvering her phone cord that was on the floor ? ? ?Stairs ?  ?  ?  ?  ?  ? ? ?Wheelchair Mobility ?  ? ?Modified Rankin  (Stroke Patients Only) ?  ? ? ?  ?Balance   ?  ?  ?  ?  ?  ?  ?  ?  ?  ?  ?  ?  ?  ?  ?  ?  ?  ?  ?  ? ?  ?Cognition Arousal/Alertness: Awake/alert ?Behavior During Therapy: [facility] for tasks assessed/performed ?Overall Cognitive Status: Within Functional Limits for tasks assessed ?  ?  ?  ?  ?  ?  ?  ?  ?  ?  ?  ?  ?  ?  ?  ?  ?  ?  ?  ? ?  ?Exercises   ? ?  ?General Comments   ?  ?  ? ?Pertinent Vitals/Pain Pain Assessment ?Pain Assessment: No/denies pain  ? ? ?Home Living   ?  ?  ?  ?  ?  ?  ?  ?  ?  ?   ?  ?Prior Function    ?  ?  ?   ? ?PT Goals (current goals can now be found in the care plan section) Progress towards PT goals: Goals met/education completed, patient discharged from PT ? ?  ?Frequency ? ? ?   ? ? ? ?  ?PT Plan Current plan remains appropriate  ? ? ?Co-evaluation   ?  ?  ?  ?  ? ?  ?AM-PAC PT "6 Clicks"  Mobility   ?Outcome Measure ? Help needed turning from your back to your side while in a flat bed without using bedrails?: None ?Help needed moving from lying on your back to sitting on the side of a flat bed without using bedrails?: None ?Help needed moving to and from a bed to a chair (including a wheelchair)?: None ?Help needed standing up from a chair using your arms (e.g., wheelchair or bedside chair)?: None ?Help needed to walk in hospital room?: None ?Help needed climbing 3-5 steps with a railing? : A Little ?6 Click Score: 23 ? ?  ?End of Session   ?Activity Tolerance: Patient tolerated treatment well ?Patient left: in chair;with call bell/phone within reach ?Nurse Communication: Mobility status ?PT Visit Diagnosis: Unsteadiness on feet (R26.81) ?  ? ? ?Time: 1103-1594 ?PT Time Calculation (min) (ACUTE ONLY): 9 min ? ?Charges:  $Gait Training: 8-22 mins          ?          ?Santiago Glad L. Tamala Julian, PT ? ?09/14/2021 ? ? ? ? ?Erika Kelley ?09/14/2021, 2:36 PM ? ?

## 2021-10-17 ENCOUNTER — Encounter (HOSPITAL_COMMUNITY): Payer: Self-pay | Admitting: Emergency Medicine

## 2021-10-17 ENCOUNTER — Emergency Department (HOSPITAL_COMMUNITY)
Admission: EM | Admit: 2021-10-17 | Discharge: 2021-10-17 | Disposition: A | Discharge status: Home / Self Care | Payer: Medicaid Other | Attending: Emergency Medicine | Admitting: Emergency Medicine

## 2021-10-17 ENCOUNTER — Other Ambulatory Visit: Payer: Self-pay

## 2021-10-17 DIAGNOSIS — I10 Essential (primary) hypertension: Secondary | ICD-10-CM | POA: Insufficient documentation

## 2021-10-17 DIAGNOSIS — H1032 Unspecified acute conjunctivitis, left eye: Secondary | ICD-10-CM | POA: Insufficient documentation

## 2021-10-17 MED ORDER — ERYTHROMYCIN 5 MG/GM OP OINT: TOPICAL_OINTMENT | OPHTHALMIC | 0 refills | Status: DC

## 2021-10-17 MED ORDER — OLOPATADINE HCL 0.1 % OP SOLN: 1.0000 [drp] | Freq: Two times a day (BID) | OPHTHALMIC | 1 refills | Status: DC

## 2021-10-17 NOTE — ED Provider Notes (Signed)
?[facility] ?Provider Note ? ? ?CSN: [id number] ?Arrival date & time: 10/17/21  [id number] ? ?  ? ?History ? ?Chief Complaint  ?Patient presents with  ? Eye Pain  ? ? ?Erika Kelley is a 51 y.o. female with past medical history significant for hypertension Who presents with concern for left eye redness, pain, swelling, with some drainage.  Patient denies overt yellowish drainage, crusting of the eyelids in the morning but does report some whitish drainage at the internal corner of the left eye for much of the day yesterday.  She denies fever, chills.  She reports that her granddaughter had a similar eye infection about a week ago.  Patient reports that other than irritation and swelling it is not affecting her vision whatsoever.  She denies any light sensitivity. ? ? ?Eye Pain ? ? ?  ? ?Home Medications ?Prior to Admission medications   ?Medication Sig Start Date End Date Taking? Authorizing Provider  ?erythromycin ophthalmic ointment Place a 1/2 inch ribbon of ointment into the lower eyelid three to four times daily for 5 days 10/17/21  Yes Crystel Demarco H, PA-C  ?olopatadine (PATADAY) 0.1 % ophthalmic solution Place 1 drop into the left eye 2 (two) times daily. 10/17/21  Yes Clarie Camey H, PA-C  ?Ipratropium-Albuterol (COMBIVENT) 20-100 MCG/ACT AERS respimat Inhale 2 puffs into the lungs every 6 (six) hours as needed for wheezing. 09/14/21   Bonnell Public, MD  ?nicotine (NICODERM CQ - DOSED IN MG/24 HOURS) 21 mg/24hr patch Place 1 patch (21 mg total) onto the skin daily. 09/15/21   Bonnell Public, MD  ?pantoprazole (PROTONIX) 40 MG tablet Take 1 tablet (40 mg total) by mouth daily. 09/15/21 10/15/21  Bonnell Public, MD  ?PHENobarbital (LUMINAL) 32.4 MG tablet Take 1 tablet (32.4 mg total) by mouth every 8 (eight) hours for 6 doses. 09/14/21 09/16/21  Dana Allan I, MD  ?thiamine 100 MG tablet Take 1 tablet (100 mg total) by mouth daily. 09/15/21   Bonnell Public,  MD  ?   ? ?Allergies    ?Patient has no known allergies.   ? ?Review of Systems   ?Review of Systems  ?Eyes:  Positive for pain and itching.  ?All other systems reviewed and are negative. ? ?Physical Exam ?Updated Vital Signs ?BP (!) 163/101 (BP Location: Left Arm) Comment: Patient does have HTN and takes medication, reports she has not had her medication yet today.  Pulse 78   Temp 98.2 ?F (36.8 ?C) (Oral)   Resp 18   Ht 5\' 6"  (1.676 m)   Wt 72.6 kg   LMP 05/25/2015   SpO2 97%   BMI 25.82 kg/m?  ?Physical Exam ?Vitals and nursing note reviewed.  ?Constitutional:   ?   General: She is not in acute distress. ?   Appearance: Normal appearance.  ?HENT:  ?   Head: Normocephalic and atraumatic.  ?Eyes:  ?   General:     ?   Right eye: No discharge.     ?   Left eye: Discharge present. ?   Comments: Patient with some redness, swelling of the eyelids, erythema of the conjunctive a of the left eye.  I do not see any overt purulent drainage.  Patient has intact extraocular movements, pupils are equal round reactive to light bilaterally, patient without photophobia.  She has no proptosis, no pain with extraocular movements.  ?Cardiovascular:  ?   Rate and Rhythm: Normal rate and regular rhythm.  ?Pulmonary:  ?  Effort: Pulmonary effort is normal. No respiratory distress.  ?Musculoskeletal:     ?   General: No deformity.  ?Skin: ?   General: Skin is warm and dry.  ?Neurological:  ?   Mental Status: She is alert and oriented to person, place, and time.  ?Psychiatric:     ?   Mood and Affect: Mood normal.     ?   Behavior: Behavior normal.  ? ? ?ED Results / Procedures / Treatments   ?Labs ?(all labs ordered are listed, but only abnormal results are displayed) ?Labs Reviewed - No data to display ? ?EKG ?None ? ?Radiology ?No results found. ? ?Procedures ?Procedures  ? ? ?Medications Ordered in ED ?Medications - No data to display ? ?ED Course/ Medical Decision Making/ A&P ?  ?                        ?Medical Decision  Making ? ?Is an overall well-appearing 51 year old female with noncontributory past medical history who presents with concern for swelling, redness, irritation of the left eye for the last 3 days. Emergent differential diagnosis includes viral, bacterial, allergic conjunctivitis, preseptal or orbital cellulitis, acute angle-closure glaucoma, blepharitis, or other acute intraocular abnormality.  Less concern for trauma as patient denies any trauma to the left eye.  Higher clinical suspicion for conjunctivitis given history of family member with similar symptoms, and physical exam findings.  Patient has no worrisome signs of acute angle-closure glaucoma, no proptosis, reactive pupil, no photophobia.  Low clinical concern for septal or preorbital cellulitis as there is no evidence of skin changes to suggest this, no fluctuance, tenderness of the surrounding tissue.  There is a minimal amount of swelling.  My highest differential is allergic versus viral conjunctivitis.  However discussed with patient we will cover for bacterial as well given report of purulent drainage over the last day, granddaughter treated for bacterial conjunctivitis.  Encourage follow-up with ophthalmology, extensive return precautions to the emergency department given.  Patient discharged in stable condition at this time. ?Final Clinical Impression(s) / ED Diagnoses ?Final diagnoses:  ?Acute conjunctivitis of left eye, unspecified acute conjunctivitis type  ? ? ?Rx / DC Orders ?ED Discharge Orders   ? ?      Ordered  ?  olopatadine (PATADAY) 0.1 % ophthalmic solution  2 times daily       ? 10/17/21 1007  ?  erythromycin ophthalmic ointment       ? 10/17/21 1007  ? ?  ?  ? ?  ? ? ?  ?Anselmo Pickler, PA-C ?10/17/21 1012 ? ?  ?Dorie Rank, MD ?10/18/21 [phone] ? ?

## 2021-10-17 NOTE — Discharge Instructions (Addendum)
As we discussed you have some conjunctivitis of the left eye my suspicion is that it is allergic or viral in nature.  You can use the Pataday drops that I am providing which should help with the inflammation and irritation, as well as I recommend that you apply the antibacterial ointment 3-4x daily to cover in case this is a bacterial infection although that is not my highest suspicion.  If the swelling worsens, you begin to have pain with movement of the eyes please return to the emergency department for further evaluation.  Otherwise he may want to follow-up with ophthalmology. ?

## 2021-10-17 NOTE — ED Triage Notes (Signed)
Patient c/o left eye pain with clear drainage that started 2 days ago and is progressively getting worse. Denies any change in vision. Denies any yellow drainage or eye "crusting shut." Sclera red. [place] patient at park when it [place]. Family member has also been noted to have pink eye a week ago.  ?

## 2022-12-18 IMAGING — DX DG CHEST 2V
2 series · 2 of 2 positions shown · non-contrast
Comparison: Prior chest radiographs 09/11/2021 and earlier.

CLINICAL DATA: Provided history: Possible aspiration pneumonia.
Shortness of breath.

EXAM:
CHEST - 2 VIEW

[chest pa]
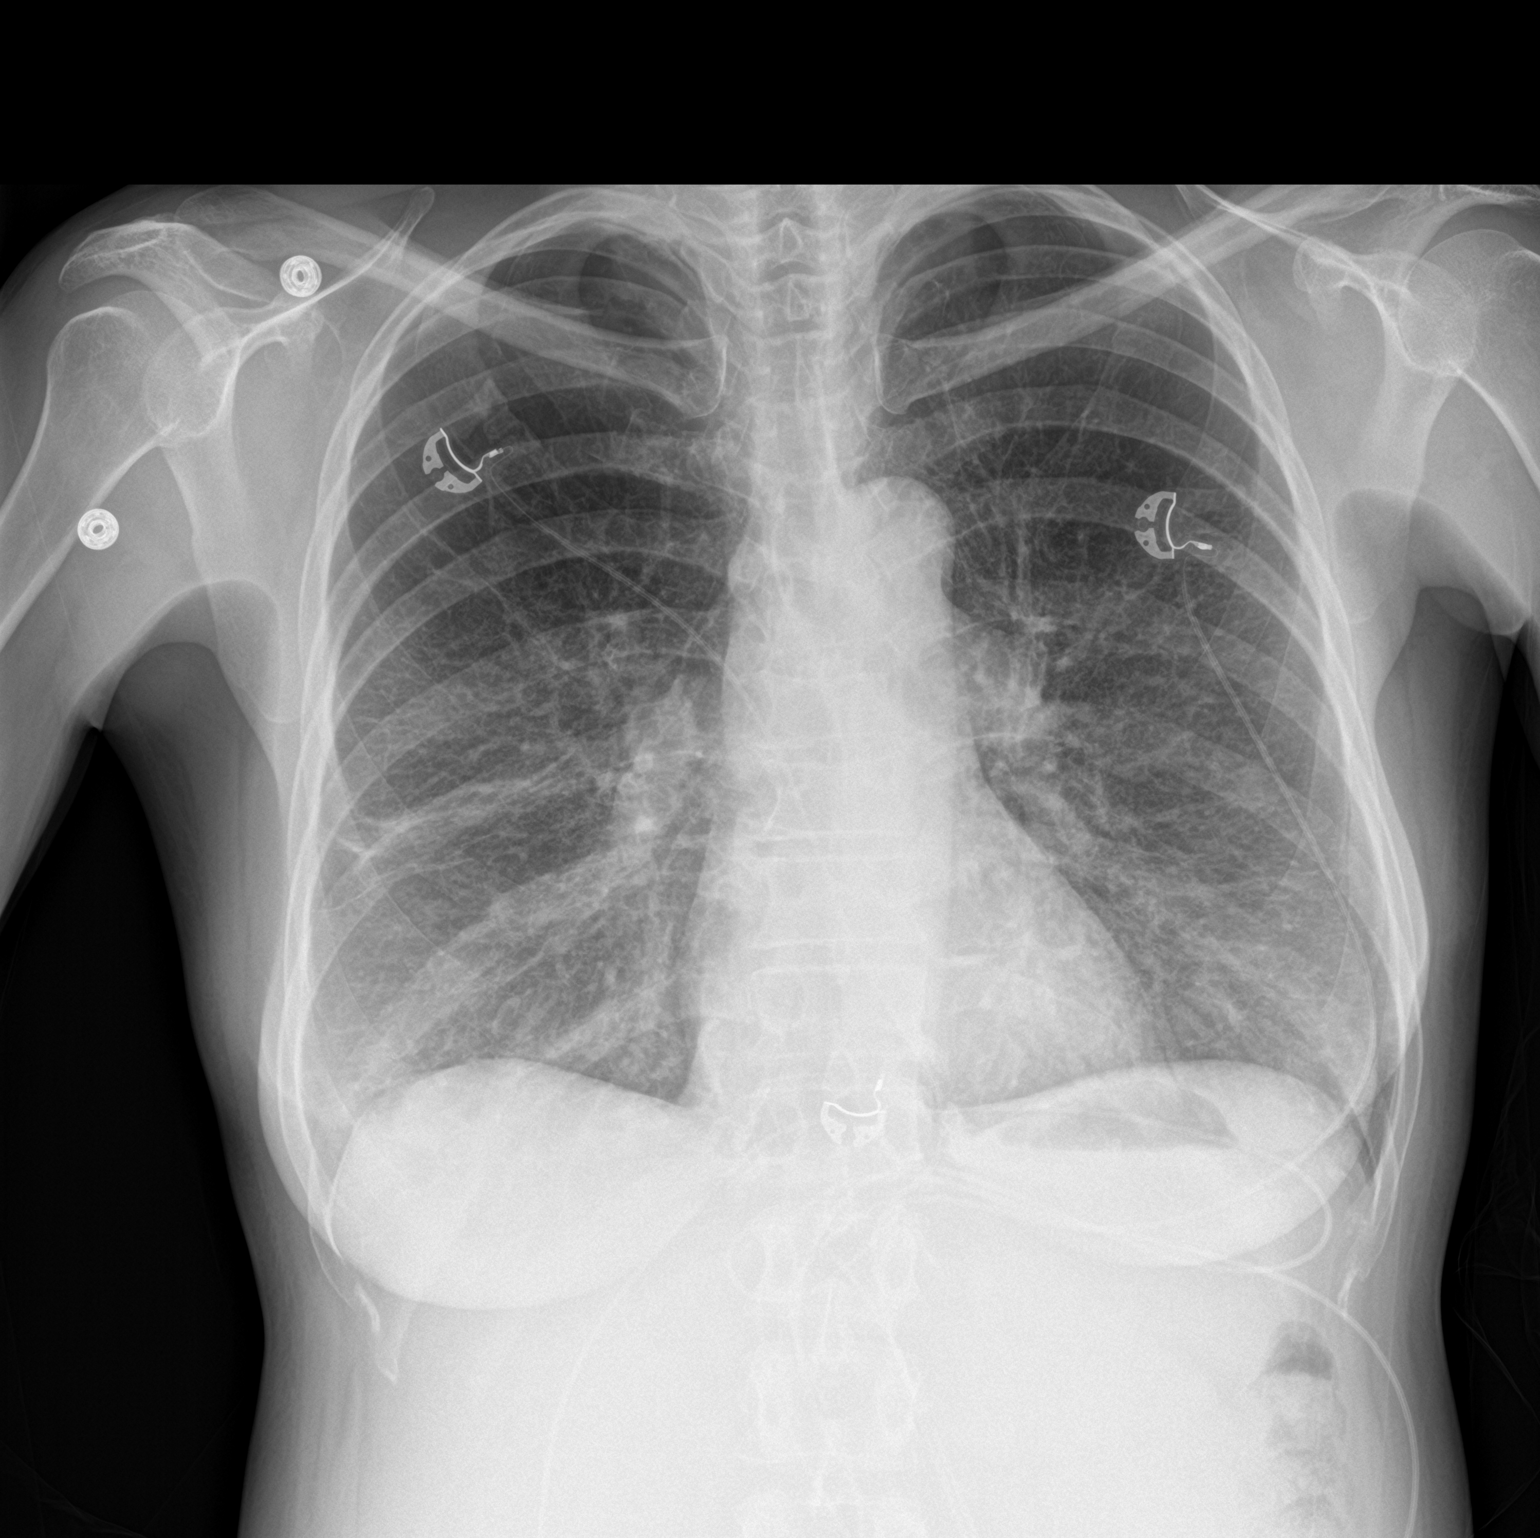

[chest lat]
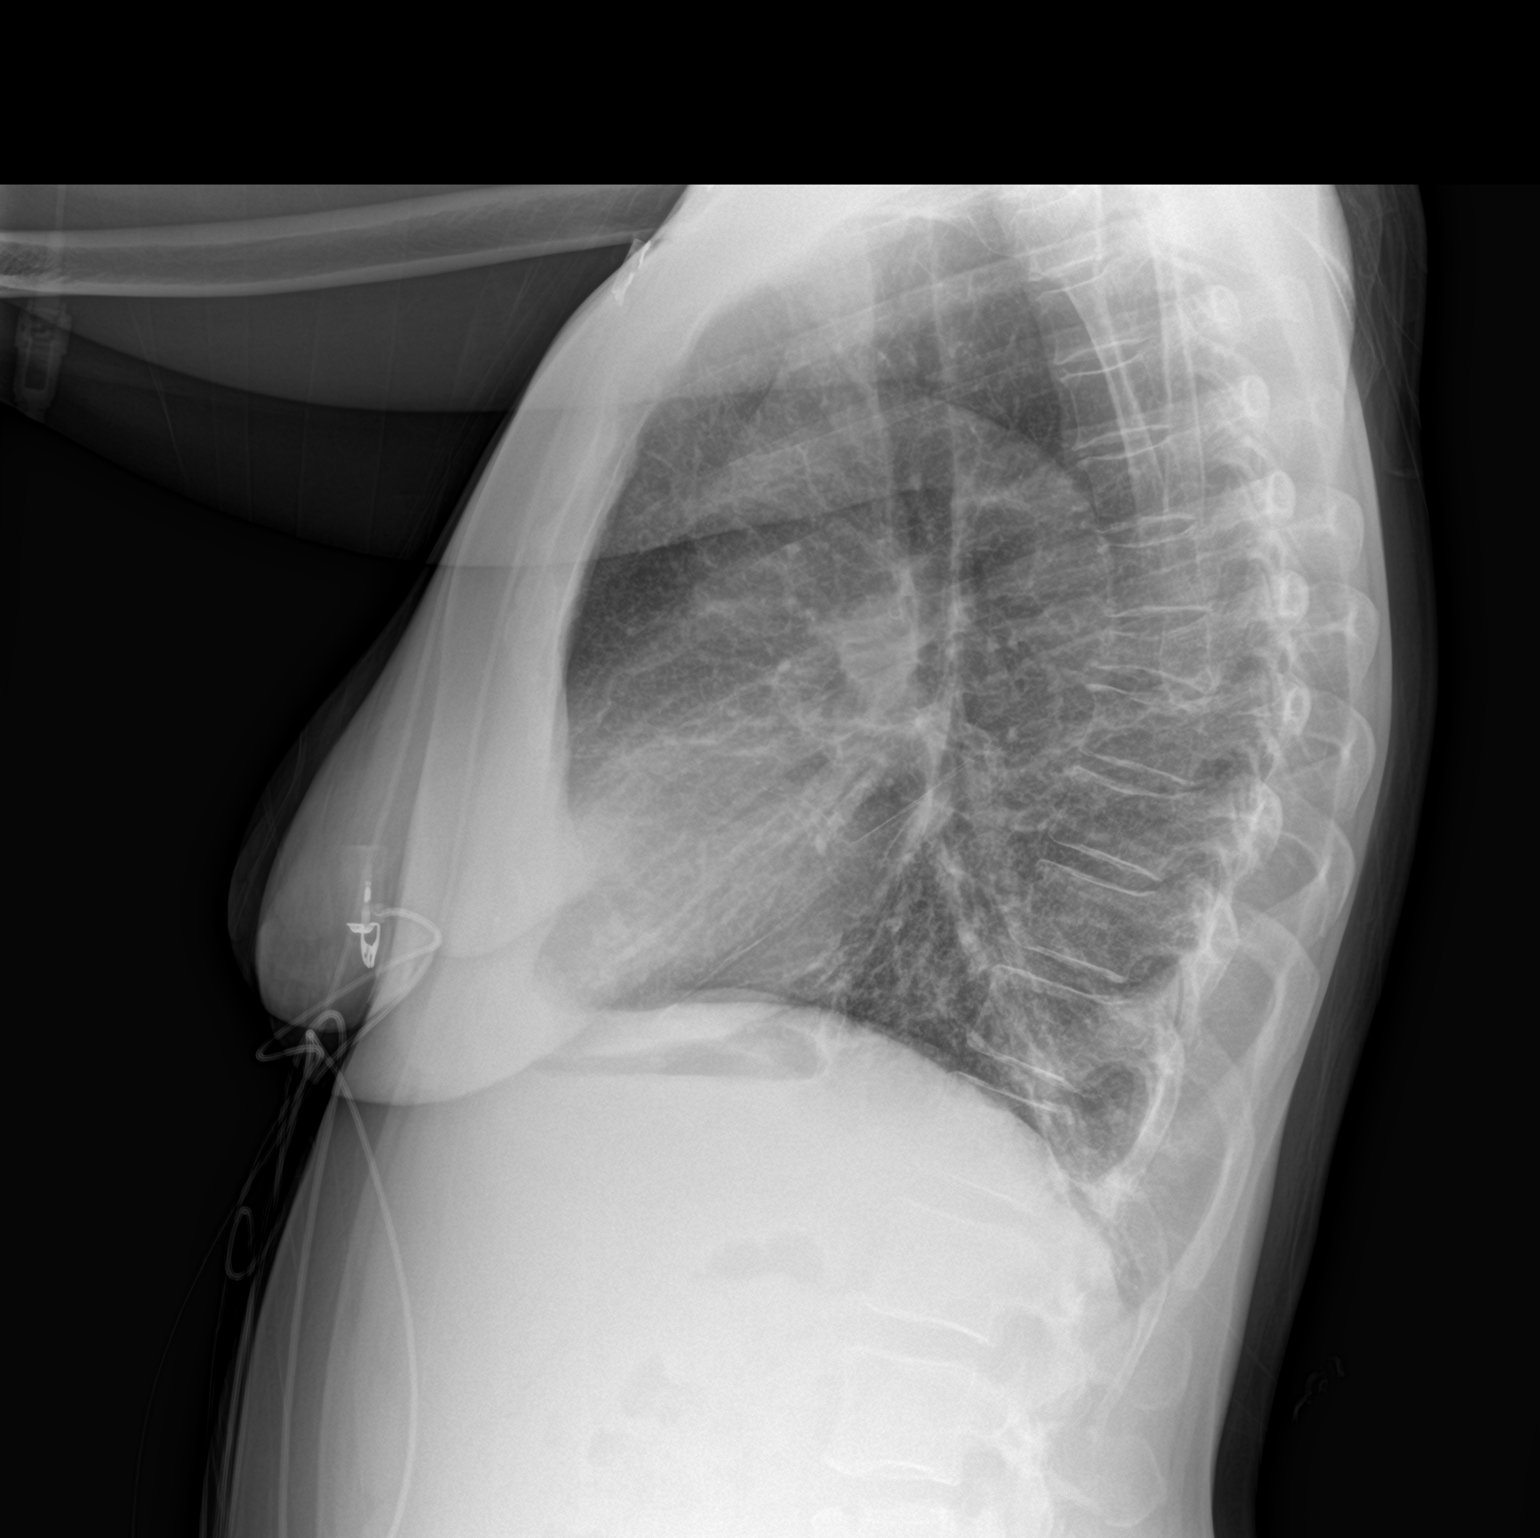

[2 of 2 positions shown; findings below may reference images not displayed]

FINDINGS: Heart size within normal limits. Although significantly improved
from the prior chest radiograph of 09/11/2021, there are persistent
interstitial and ill-defined pulmonary opacities within the mid to
lower lung fields, bilaterally. Superimposed focus of atelectasis
and/or scarring within the right mid lung field. No definite
residual pleural effusion. No evidence of pneumothorax. No acute
bony abnormality identified.
IMPRESSION: Although significantly improved from the prior chest radiograph of
09/11/2021, there are persistent interstitial and ill-defined
pulmonary opacities within the mid-to-lower lung fields,
bilaterally. Findings may reflect pulmonary edema and/or pneumonia.

Superimposed focus of atelectasis versus scarring within the right
mid lung field.

No definite residual pleural effusions.

## 2023-04-29 ENCOUNTER — Emergency Department (HOSPITAL_COMMUNITY): Payer: Commercial Managed Care - HMO

## 2023-04-29 ENCOUNTER — Inpatient Hospital Stay (HOSPITAL_COMMUNITY)
Admission: EM | Admit: 2023-04-29 | Discharge: 2023-05-03 | DRG: 308 | Disposition: A | Discharge status: Home / Self Care | Payer: Commercial Managed Care - HMO | Attending: Family Medicine | Admitting: Family Medicine

## 2023-04-29 ENCOUNTER — Encounter (HOSPITAL_COMMUNITY): Payer: Self-pay

## 2023-04-29 ENCOUNTER — Other Ambulatory Visit: Payer: Self-pay

## 2023-04-29 DIAGNOSIS — I2510 Atherosclerotic heart disease of native coronary artery without angina pectoris: Secondary | ICD-10-CM | POA: Diagnosis present

## 2023-04-29 DIAGNOSIS — I110 Hypertensive heart disease with heart failure: Secondary | ICD-10-CM | POA: Diagnosis present

## 2023-04-29 DIAGNOSIS — R9431 Abnormal electrocardiogram [ECG] [EKG]: Secondary | ICD-10-CM | POA: Diagnosis not present

## 2023-04-29 DIAGNOSIS — E876 Hypokalemia: Secondary | ICD-10-CM | POA: Diagnosis present

## 2023-04-29 DIAGNOSIS — E43 Unspecified severe protein-calorie malnutrition: Secondary | ICD-10-CM | POA: Diagnosis present

## 2023-04-29 DIAGNOSIS — E871 Hypo-osmolality and hyponatremia: Secondary | ICD-10-CM | POA: Diagnosis present

## 2023-04-29 DIAGNOSIS — J9601 Acute respiratory failure with hypoxia: Secondary | ICD-10-CM | POA: Diagnosis present

## 2023-04-29 DIAGNOSIS — I426 Alcoholic cardiomyopathy: Secondary | ICD-10-CM | POA: Diagnosis present

## 2023-04-29 DIAGNOSIS — I4721 Torsades de pointes: Secondary | ICD-10-CM | POA: Diagnosis present

## 2023-04-29 DIAGNOSIS — I462 Cardiac arrest due to underlying cardiac condition: Secondary | ICD-10-CM | POA: Diagnosis present

## 2023-04-29 DIAGNOSIS — I472 Ventricular tachycardia, unspecified: Secondary | ICD-10-CM | POA: Diagnosis not present

## 2023-04-29 DIAGNOSIS — Z634 Disappearance and death of family member: Secondary | ICD-10-CM

## 2023-04-29 DIAGNOSIS — I420 Dilated cardiomyopathy: Secondary | ICD-10-CM | POA: Diagnosis not present

## 2023-04-29 DIAGNOSIS — R160 Hepatomegaly, not elsewhere classified: Secondary | ICD-10-CM | POA: Diagnosis present

## 2023-04-29 DIAGNOSIS — D6959 Other secondary thrombocytopenia: Secondary | ICD-10-CM | POA: Diagnosis present

## 2023-04-29 DIAGNOSIS — F17210 Nicotine dependence, cigarettes, uncomplicated: Secondary | ICD-10-CM | POA: Diagnosis present

## 2023-04-29 DIAGNOSIS — J441 Chronic obstructive pulmonary disease with (acute) exacerbation: Secondary | ICD-10-CM

## 2023-04-29 DIAGNOSIS — J440 Chronic obstructive pulmonary disease with acute lower respiratory infection: Secondary | ICD-10-CM | POA: Diagnosis present

## 2023-04-29 DIAGNOSIS — I10 Essential (primary) hypertension: Secondary | ICD-10-CM | POA: Diagnosis not present

## 2023-04-29 DIAGNOSIS — R55 Syncope and collapse: Secondary | ICD-10-CM | POA: Diagnosis present

## 2023-04-29 DIAGNOSIS — J189 Pneumonia, unspecified organism: Secondary | ICD-10-CM | POA: Diagnosis present

## 2023-04-29 DIAGNOSIS — F1010 Alcohol abuse, uncomplicated: Secondary | ICD-10-CM | POA: Diagnosis not present

## 2023-04-29 DIAGNOSIS — I700 Atherosclerosis of aorta: Secondary | ICD-10-CM | POA: Diagnosis not present

## 2023-04-29 DIAGNOSIS — I4729 Other ventricular tachycardia: Secondary | ICD-10-CM | POA: Diagnosis not present

## 2023-04-29 DIAGNOSIS — I5021 Acute systolic (congestive) heart failure: Secondary | ICD-10-CM | POA: Diagnosis present

## 2023-04-29 DIAGNOSIS — R2681 Unsteadiness on feet: Secondary | ICD-10-CM | POA: Diagnosis not present

## 2023-04-29 DIAGNOSIS — R17 Unspecified jaundice: Secondary | ICD-10-CM | POA: Diagnosis present

## 2023-04-29 DIAGNOSIS — R7401 Elevation of levels of liver transaminase levels: Secondary | ICD-10-CM | POA: Diagnosis present

## 2023-04-29 DIAGNOSIS — F10239 Alcohol dependence with withdrawal, unspecified: Secondary | ICD-10-CM | POA: Diagnosis present

## 2023-04-29 DIAGNOSIS — Z7982 Long term (current) use of aspirin: Secondary | ICD-10-CM

## 2023-04-29 DIAGNOSIS — I469 Cardiac arrest, cause unspecified: Secondary | ICD-10-CM | POA: Diagnosis not present

## 2023-04-29 DIAGNOSIS — Z6825 Body mass index (BMI) 25.0-25.9, adult: Secondary | ICD-10-CM

## 2023-04-29 DIAGNOSIS — Z79899 Other long term (current) drug therapy: Secondary | ICD-10-CM

## 2023-04-29 HISTORY — DX: Ventricular tachycardia, unspecified: I47.20

## 2023-04-29 HISTORY — DX: Alcohol abuse, uncomplicated: F10.10

## 2023-04-29 LAB — CBC WITH DIFFERENTIAL/PLATELET
Abs Immature Granulocytes: 0.03 10*3/uL (ref 0.00–0.07)
Basophils Absolute: 0 10*3/uL (ref 0.0–0.1)
Basophils Relative: 0 %
Eosinophils Absolute: 0 10*3/uL (ref 0.0–0.5)
Eosinophils Relative: 1 %
HCT: 40.3 % (ref 36.0–46.0)
Hemoglobin: 13.2 g/dL (ref 12.0–15.0)
Immature Granulocytes: 1 %
Lymphocytes Relative: 18 %
Lymphs Abs: 1 10*3/uL (ref 0.7–4.0)
MCH: 28.8 pg (ref 26.0–34.0)
MCHC: 32.8 g/dL (ref 30.0–36.0)
MCV: 87.8 fL (ref 80.0–100.0)
Monocytes Absolute: 0.6 10*3/uL (ref 0.1–1.0)
Monocytes Relative: 11 %
Neutro Abs: 3.8 10*3/uL (ref 1.7–7.7)
Neutrophils Relative %: 69 %
Platelets: 125 10*3/uL — ABNORMAL LOW (ref 150–400)
RBC: 4.59 MIL/uL (ref 3.87–5.11)
RDW: 14.6 % (ref 11.5–15.5)
WBC: 5.5 10*3/uL (ref 4.0–10.5)
nRBC: 0 % (ref 0.0–0.2)

## 2023-04-29 LAB — CBC
HCT: 34.2 % — ABNORMAL LOW (ref 36.0–46.0)
Hemoglobin: 11.5 g/dL — ABNORMAL LOW (ref 12.0–15.0)
MCH: 28.2 pg (ref 26.0–34.0)
MCHC: 33.6 g/dL (ref 30.0–36.0)
MCV: 83.8 fL (ref 80.0–100.0)
Platelets: 96 10*3/uL — ABNORMAL LOW (ref 150–400)
RBC: 4.08 MIL/uL (ref 3.87–5.11)
RDW: 13.9 % (ref 11.5–15.5)
WBC: 3.7 10*3/uL — ABNORMAL LOW (ref 4.0–10.5)
nRBC: 0 % (ref 0.0–0.2)

## 2023-04-29 LAB — TROPONIN I (HIGH SENSITIVITY)
Troponin I (High Sensitivity): 119 ng/L (ref <18)
Troponin I (High Sensitivity): 113 ng/L (ref <18)

## 2023-04-29 LAB — COMPREHENSIVE METABOLIC PANEL
ALT: 52 U/L — ABNORMAL HIGH (ref 0–44)
AST: 190 U/L — ABNORMAL HIGH (ref 15–41)
Albumin: 3.3 g/dL — ABNORMAL LOW (ref 3.5–5.0)
Alkaline Phosphatase: 159 U/L — ABNORMAL HIGH (ref 38–126)
Anion gap: 13 (ref 5–15)
BUN: <5 mg/dL — ABNORMAL LOW (ref 6–20)
CO2: 25 mmol/L (ref 22–32)
Calcium: 7.7 mg/dL — ABNORMAL LOW (ref 8.9–10.3)
Chloride: 97 mmol/L — ABNORMAL LOW (ref 98–111)
Creatinine, Ser: 0.49 mg/dL (ref 0.44–1.00)
GFR, Estimated: >60 mL/min (ref >60)
Glucose, Bld: 156 mg/dL — ABNORMAL HIGH (ref 70–99)
Potassium: 2.7 mmol/L — CL (ref 3.5–5.1)
Sodium: 135 mmol/L (ref 135–145)
Total Bilirubin: 1.6 mg/dL — ABNORMAL HIGH (ref <1.2)
Total Protein: 7.5 g/dL (ref 6.5–8.1)

## 2023-04-29 LAB — I-STAT CHEM 8, ED
BUN: <3 mg/dL — ABNORMAL LOW (ref 6–20)
Calcium, Ion: 1.11 mmol/L — ABNORMAL LOW (ref 1.15–1.40)
Chloride: 96 mmol/L — ABNORMAL LOW (ref 98–111)
Creatinine, Ser: 0.4 mg/dL — ABNORMAL LOW (ref 0.44–1.00)
Glucose, Bld: 167 mg/dL — ABNORMAL HIGH (ref 70–99)
HCT: 43 % (ref 36.0–46.0)
Hemoglobin: 14.6 g/dL (ref 12.0–15.0)
Potassium: 3.3 mmol/L — ABNORMAL LOW (ref 3.5–5.1)
Sodium: 139 mmol/L (ref 135–145)
TCO2: 27 mmol/L (ref 22–32)

## 2023-04-29 LAB — MRSA NEXT GEN BY PCR, NASAL: MRSA by PCR Next Gen: NOT DETECTED

## 2023-04-29 LAB — RESPIRATORY PANEL BY PCR

## 2023-04-29 LAB — CREATININE, SERUM
Creatinine, Ser: 0.49 mg/dL (ref 0.44–1.00)
GFR, Estimated: >60 mL/min (ref >60)

## 2023-04-29 LAB — CBG MONITORING, ED: Glucose-Capillary: 172 mg/dL — ABNORMAL HIGH (ref 70–99)

## 2023-04-29 LAB — MAGNESIUM: Magnesium: 1.3 mg/dL — ABNORMAL LOW (ref 1.7–2.4)

## 2023-04-29 LAB — BRAIN NATRIURETIC PEPTIDE: B Natriuretic Peptide: 488.8 pg/mL — ABNORMAL HIGH (ref 0.0–100.0)

## 2023-04-29 LAB — HIV ANTIBODY (ROUTINE TESTING W REFLEX): HIV Screen 4th Generation wRfx: NONREACTIVE

## 2023-04-29 MED ORDER — MAGNESIUM SULFATE 2 GM/50ML IV SOLN
INTRAVENOUS | Status: AC
  Administered 2023-04-29: 2 g via INTRAVENOUS
  Filled 2023-04-29: qty 50

## 2023-04-29 MED ORDER — ORAL CARE MOUTH RINSE: 15.0000 mL | OROMUCOSAL | Status: DC | PRN

## 2023-04-29 MED ORDER — AMIODARONE IV BOLUS ONLY 150 MG/100ML
INTRAVENOUS | Status: AC
  Administered 2023-04-29: 150 mg
  Filled 2023-04-29: qty 100

## 2023-04-29 MED ORDER — ONDANSETRON HCL 4 MG/2ML IJ SOLN: 4.0000 mg | Freq: Four times a day (QID) | INTRAMUSCULAR | Status: DC | PRN

## 2023-04-29 MED ORDER — AMIODARONE HCL IN DEXTROSE 360-4.14 MG/200ML-% IV SOLN: 60.0000 mg/h | INTRAVENOUS | Status: DC

## 2023-04-29 MED ORDER — ASPIRIN 81 MG PO TBEC
81.0000 mg | DELAYED_RELEASE_TABLET | Freq: Every day | ORAL | Status: DC
  Administered 2023-04-30 – 2023-05-03 (×4): 81 mg via ORAL
  Filled 2023-04-29 (×4): qty 1

## 2023-04-29 MED ORDER — ENOXAPARIN SODIUM 40 MG/0.4ML IJ SOSY
40.0000 mg | PREFILLED_SYRINGE | INTRAMUSCULAR | Status: DC
  Administered 2023-04-30 – 2023-05-03 (×4): 40 mg via SUBCUTANEOUS
  Filled 2023-04-29 (×4): qty 0.4

## 2023-04-29 MED ORDER — CALCIUM GLUCONATE 10 % IV SOLN
INTRAVENOUS | Status: AC
  Filled 2023-04-29: qty 10

## 2023-04-29 MED ORDER — POTASSIUM CHLORIDE 10 MEQ/100ML IV SOLN
10.0000 meq | INTRAVENOUS | Status: DC
Start: 2023-04-29 — End: 2023-04-29

## 2023-04-29 MED ORDER — AMIODARONE HCL IN DEXTROSE 360-4.14 MG/200ML-% IV SOLN
INTRAVENOUS | Status: AC
  Filled 2023-04-29: qty 200

## 2023-04-29 MED ORDER — NITROGLYCERIN 0.4 MG SL SUBL: 0.4000 mg | SUBLINGUAL_TABLET | SUBLINGUAL | Status: DC | PRN

## 2023-04-29 MED ORDER — AMIODARONE HCL IN DEXTROSE 360-4.14 MG/200ML-% IV SOLN: 30.0000 mg/h | INTRAVENOUS | Status: DC

## 2023-04-29 MED ORDER — ACETAMINOPHEN 325 MG PO TABS: 650.0000 mg | ORAL_TABLET | ORAL | Status: DC | PRN

## 2023-04-29 MED ORDER — POTASSIUM CHLORIDE 10 MEQ/100ML IV SOLN
10.0000 meq | INTRAVENOUS | Status: AC
Start: 2023-04-29 — End: 2023-04-29
  Administered 2023-04-29 (×6): 10 meq via INTRAVENOUS
  Filled 2023-04-29 (×4): qty 100

## 2023-04-29 MED ORDER — PANTOPRAZOLE SODIUM 40 MG PO TBEC: 40.0000 mg | DELAYED_RELEASE_TABLET | Freq: Every day | ORAL | Status: DC

## 2023-04-29 MED ORDER — CHLORHEXIDINE GLUCONATE CLOTH 2 % EX PADS
6.0000 | MEDICATED_PAD | Freq: Every day | CUTANEOUS | Status: DC
  Administered 2023-04-29 – 2023-05-03 (×5): 6 via TOPICAL

## 2023-04-29 MED ORDER — LIDOCAINE IN D5W 4-5 MG/ML-% IV SOLN
1.0000 mg/min | INTRAVENOUS | Status: DC
  Administered 2023-04-29 – 2023-04-30 (×2): 2 mg/min via INTRAVENOUS
  Filled 2023-04-29 (×3): qty 500

## 2023-04-29 MED ORDER — MAGNESIUM SULFATE 2 GM/50ML IV SOLN: 2.0000 g | Freq: Once | INTRAVENOUS | Status: AC

## 2023-04-29 MED ORDER — OLOPATADINE HCL 0.1 % OP SOLN: 1.0000 [drp] | Freq: Two times a day (BID) | OPHTHALMIC | Status: DC

## 2023-04-29 MED ORDER — LIDOCAINE BOLUS VIA INFUSION
100.0000 mg | Freq: Once | INTRAVENOUS | Status: AC
  Administered 2023-04-29: 100 mg via INTRAVENOUS
  Filled 2023-04-29: qty 100

## 2023-04-29 MED ORDER — THIAMINE HCL 100 MG PO TABS: 100.0000 mg | ORAL_TABLET | Freq: Every day | ORAL | Status: DC

## 2023-04-29 NOTE — Progress Notes (Addendum)
Patient examined at bedside upon arrival to [facility]. She states she feel improved, no chest pain or SOB. She is on [place] oxygen since [facility]. She is stable clinically. She reports chills and cough with phelgm that are new for a week. Lung sound with scattered fine crackles. Will check respiratory viral panel and COVID-19, and BNP in addition to current workup.

## 2023-04-29 NOTE — ED Notes (Signed)
ED TO INPATIENT HANDOFF REPORT  ED Nurse Name and Phone #: Wandra Mannan, Paramedic  S Name/Age/Gender Erika Kelley 52 y.o. female Room/Bed: APA06/APA06  Code Status   Code Status: Full Code  Home/SNF/Other Home Patient oriented to: self, place, time, and situation Is this baseline? Yes   Triage Complete: Triage complete  Chief Complaint VT (ventricular tachycardia) (HCC) [I47.20]  Triage Note Light headed Dizzy  Diaphoretic Vomiting Started at [facility] streaming in waiting area stating pt is having a heart attack  Pt denies all CP and SOB   Allergies No Known Allergies  Level of Care/Admitting Diagnosis ED Disposition     ED Disposition  Admit   Condition  --   Comment  Hospital Area: [facility] [[id number]]  Level of Care: ICU [6]  May admit patient to [facility] or [facility] if equivalent level of care is available:: No  Interfacility transfer: Yes  Covid Evaluation: Asymptomatic - no recent exposure (last 10 days) testing not required  Diagnosis: VT (ventricular tachycardia) [facility]) [[id number]]  Admitting Physician: Marjo Bicker [[id number]]  Attending Physician: Marjo Bicker [[id number]]  Bed request comments: 2H  Certification:: I certify this patient will need inpatient services for at least 2 midnights  Expected Medical Readiness: 05/02/2023          B Medical/Surgery History Past Medical History:  Diagnosis Date   Hypertension    Past Surgical History:  Procedure Laterality Date   CESAREAN SECTION       A IV Location/Drains/Wounds Patient Lines/Drains/Airways Status     Active Line/Drains/Airways     Name Placement date Placement time Site Days   Peripheral IV 04/29/23 18 G 1.16" Right Antecubital 04/29/23  --  Antecubital  less than 1   Peripheral IV 04/29/23 20 G 1" Left;Posterior Forearm 04/29/23  1406  Forearm  less than 1            Intake/Output Last 24 hours  Intake/Output  Summary (Last 24 hours) at 04/29/2023 1608 Last data filed at 04/29/2023 1455 Gross per 24 hour  Intake 137.24 ml  Output --  Net 137.24 ml    Labs/Imaging Results for orders placed or performed during the hospital encounter of 04/29/23 (from the past 48 hour(s))  POC CBG, ED     Status: Abnormal   Collection Time: 04/29/23  1:17 PM  Result Value Ref Range   Glucose-Capillary 172 (H) 70 - 99 mg/dL    Comment: Glucose reference range applies only to samples taken after fasting for at least 8 hours.  CBC with Differential     Status: Abnormal   Collection Time: 04/29/23  1:35 PM  Result Value Ref Range   WBC 5.5 4.0 - 10.5 K/uL   RBC 4.59 3.87 - 5.11 MIL/uL   Hemoglobin 13.2 12.0 - 15.0 g/dL   HCT 47.4 25.9 - 56.3 %   MCV 87.8 80.0 - 100.0 fL   MCH 28.8 26.0 - 34.0 pg   MCHC 32.8 30.0 - 36.0 g/dL   RDW 87.5 64.3 - 32.9 %   Platelets 125 (L) 150 - 400 K/uL   nRBC 0.0 0.0 - 0.2 %   Neutrophils Relative % 69 %   Neutro Abs 3.8 1.7 - 7.7 K/uL   Lymphocytes Relative 18 %   Lymphs Abs 1.0 0.7 - 4.0 K/uL   Monocytes Relative 11 %   Monocytes Absolute 0.6 0.1 - 1.0 K/uL   Eosinophils Relative 1 %   Eosinophils  Absolute 0.0 0.0 - 0.5 K/uL   Basophils Relative 0 %   Basophils Absolute 0.0 0.0 - 0.1 K/uL   Immature Granulocytes 1 %   Abs Immature Granulocytes 0.03 0.00 - 0.07 K/uL    Comment: Performed at [facility], [address], [place], [place] [address]  Comprehensive metabolic panel     Status: Abnormal   Collection Time: 04/29/23  1:35 PM  Result Value Ref Range   Sodium 135 135 - 145 mmol/L   Potassium 2.7 (LL) 3.5 - 5.1 mmol/L    Comment: CRITICAL RESULT CALLED TO, READ BACK BY AND VERIFIED WITH EDWARDS C @ 1426 ON [id number] BY HENDERSON L   Chloride 97 (L) 98 - 111 mmol/L   CO2 25 22 - 32 mmol/L   Glucose, Bld 156 (H) 70 - 99 mg/dL    Comment: Glucose reference range applies only to samples taken after fasting for at least 8 hours.   BUN <5 (L) 6 - 20 mg/dL    Creatinine, Ser 2.95 0.44 - 1.00 mg/dL   Calcium 7.7 (L) 8.9 - 10.3 mg/dL   Total Protein 7.5 6.5 - 8.1 g/dL   Albumin 3.3 (L) 3.5 - 5.0 g/dL   AST 621 (H) 15 - 41 U/L   ALT 52 (H) 0 - 44 U/L   Alkaline Phosphatase 159 (H) 38 - 126 U/L   Total Bilirubin 1.6 (H) <1.2 mg/dL   GFR, Estimated >30 >86 mL/min    Comment: (NOTE) Calculated using the CKD-EPI Creatinine Equation (2021)    Anion gap 13 5 - 15    Comment: Performed at [facility], [address], [place], [place] [address]  Troponin I (High Sensitivity)     Status: Abnormal   Collection Time: 04/29/23  1:35 PM  Result Value Ref Range   Troponin I (High Sensitivity) 119 (HH) <18 ng/L    Comment: CRITICAL RESULT CALLED TO, READ BACK BY AND VERIFIED WITH EDWARDS C @ 1427 ON [id number] BY HENDERSON L (NOTE) Elevated high sensitivity troponin I (hsTnI) values and significant  changes across serial measurements may suggest ACS but many other  chronic and acute conditions are known to elevate hsTnI results.  Refer to the "Links" section for chest pain algorithms and additional  guidance. Performed at [facility], [address] [place], [place] [address]   I-stat chem 8, ED (not at [facility], [facility] or [facility] [place] [facility])     Status: Abnormal   Collection Time: 04/29/23  1:41 PM  Result Value Ref Range   Sodium 139 135 - 145 mmol/L   Potassium 3.3 (L) 3.5 - 5.1 mmol/L   Chloride 96 (L) 98 - 111 mmol/L   BUN <3 (L) 6 - 20 mg/dL   Creatinine, Ser 4.40 (L) 0.44 - 1.00 mg/dL   Glucose, Bld 102 (H) 70 - 99 mg/dL    Comment: Glucose reference range applies only to samples taken after fasting for at least 8 hours.   Calcium, Ion 1.11 (L) 1.15 - 1.40 mmol/L   TCO2 27 22 - 32 mmol/L   Hemoglobin 14.6 12.0 - 15.0 g/dL   HCT 72.5 36.6 - 44.0 %  Magnesium     Status: Abnormal   Collection Time: 04/29/23  2:02 PM  Result Value Ref Range   Magnesium 1.3 (L) 1.7 - 2.4 mg/dL    Comment: Performed at [facility], [address] [place], [place] [address]   DG  Chest Portable 1 View  Result Date: 04/29/2023 CLINICAL DATA:  Ventricular  tachycardia. EXAM: PORTABLE CHEST 1 VIEW COMPARISON:  09/14/2021 FINDINGS: External pacer paddles are noted. The cardiac silhouette, mediastinal and hilar contours are within normal limits. Prominent perihilar interstitial markings suggesting mild perihilar interstitial edema. No pleural effusions. No pulmonary infiltrates. No pneumothorax. The bony thorax is intact. IMPRESSION: Mild perihilar interstitial edema. Electronically Signed   By: Rudie Meyer M.D.   On: 04/29/2023 14:53    Pending Labs Unresulted Labs (From admission, onward)     Start     Ordered   04/30/23 0500  Magnesium  Tomorrow morning,   R        04/29/23 1534   Signed and Held  HIV Antibody (routine testing w rflx)  (HIV Antibody (Routine testing w reflex) panel)  Once,   R        Signed and Held   Signed and Held  Lipoprotein A (LPA)  Tomorrow morning,   R        Signed and Held   Signed and Held  CBC  (enoxaparin (LOVENOX)    CrCl >/= 30 ml/min)  Once,   R       Comments: Baseline for enoxaparin therapy IF NOT ALREADY DRAWN.  Notify MD if PLT < 100 K.    Signed and Held   Signed and Held  Creatinine, serum  (enoxaparin (LOVENOX)    CrCl >/= 30 ml/min)  Once,   R       Comments: Baseline for enoxaparin therapy IF NOT ALREADY DRAWN.    Signed and Held   Signed and Held  Basic metabolic panel  Tomorrow morning,   R        Signed and Held   Signed and Held  Lipid panel  Tomorrow morning,   R        Signed and Held   Signed and Held  CBC  Tomorrow morning,   R        Signed and Held            Vitals/Pain Today's Vitals   04/29/23 1312 04/29/23 1314 04/29/23 1400 04/29/23 1445  BP:  (!) 145/96 (!) 123/92 119/82  Pulse:  (!) 54 72 (!) 56  Resp:  12 15 16   Temp:  97.8 F (36.6 C)    SpO2:  97% 97% 95%  Weight:   160 lb 0.9 oz (72.6 kg)   PainSc: 0-No pain       Isolation Precautions No active  isolations  Medications Medications  lidocaine (XYLOCAINE) 4 mg/mL bolus via infusion 100 mg (100 mg Intravenous Bolus from Bag 04/29/23 1454)    And  lidocaine (cardiac) 2000 mg in dextrose 5% 500 mL (4mg /mL) IV infusion (2 mg/min Intravenous New Bag/Given 04/29/23 1453)  potassium chloride 10 mEq in 100 mL IVPB (10 mEq Intravenous New Bag/Given 04/29/23 1557)  calcium gluconate 10 % injection (  Given 04/29/23 1330)  amiodarone (NEXTERONE) 1.5 mg/mL IV bolus only (0 mg  Stopped 04/29/23 1403)  magnesium sulfate IVPB 2 g 50 mL (0 g Intravenous Stopped 04/29/23 1455)    Mobility walks     Focused Assessments Cardiac Assessment Handoff:    No results found for: "CKTOTAL", "CKMB", "CKMBINDEX", "TROPONINI" Lab Results  Component Value Date   DDIMER 0.68 (H) 03/30/2012   Does the Patient currently have chest pain? No    R Recommendations: See Admitting Provider Note  Report given to:   Additional Notes: Polymorphic hyperacute t-waves and prolonged qtc noted on 12-lead. Witnessed V-tach, sustained until  defib. Amio Bolus given and Lido drip running. Pt denies chest pain. 18ga RAC and 20ga LFA. Pt AO, ambulatory, and continent.

## 2023-04-29 NOTE — Progress Notes (Signed)
   04/29/23 1330  Spiritual Encounters  Type of Visit Initial  Care provided to: Pt not available  Referral source Code page  Reason for visit Code  OnCall Visit No   Chaplain responded to a code blue. The patient, Erika Kelley, was attended to by the medical team.  No family is present.  Valerie Roys [facility]  [phone]

## 2023-04-29 NOTE — Progress Notes (Signed)
   PCCM transfer request    Sending physician:  Sending facility: [facility]  Reason for transfer: Recurrent ventricular tachycardia status post cardioversion and now on lidocaine drip per cardiology recommendations  Brief case summary:  52 year old presented with faintness, dizziness.  Reportedly in ventricular tachycardia.  10/10 and was cardioverted.  Given amiodarone.  Cardiology consulted.  QTc was prolonged.  Recommended stopping amiodarone.  Recommended lidocaine drip.   Recommendations made prior to transfer:  Transfer accepted: No, recommend contacting cardiology service for admission to the ICU under cardiology service given reason for admission is ventricular tachycardia, recurrent, recent cardioversion, lidocaine drip with minimal medical comorbidities    Karren Burly 04/29/23 2:22 PM [facility]  For contact information, see Amion. If no response to pager, please call PCCM consult pager. After hours, 7PM- 7AM, please call Elink.

## 2023-04-29 NOTE — ED Triage Notes (Signed)
Light headed Dizzy  Diaphoretic Vomiting Started at [facility] streaming in waiting area stating pt is having a heart attack  Pt denies all CP and SOB

## 2023-04-29 NOTE — ED Notes (Signed)
Family up dated in waiting room Family calm and cooperative

## 2023-04-29 NOTE — ED Provider Notes (Cosign Needed Addendum)
[place] [facility]ERGENCY DEPARTMENT AT [facility] Provider Note   CSN: [id number] Arrival date & time: 04/29/23  1303     History  Chief Complaint  Patient presents with   Weakness    Erika Kelley is a 52 y.o. female.  He has PMH of substance abuse and hypertension on chlorthalidone only.  Presents ER today for dizziness and vomiting started around 6 AM.  Brought in by family.  Denies chest pain or shortness of breath.  Daughter eventually came to bedside and was able to give additional history that at one point just prior to arrival patient's eyes rolled back in her head and she was unresponsive.  She states she was trying to tap her on the shoulders and chest to get her to come around and eventually she did regain consciousness.  States this happened once before and she was admitted to hospital in [place].   Weakness      Home Medications Prior to Admission medications   Medication Sig Start Date End Date Taking? Authorizing Provider  acetaminophen (TYLENOL) 500 MG tablet Take 500 mg by mouth every 6 (six) hours as needed for mild pain (pain score 1-3).   Yes [provider]  cloNIDine (CATAPRES) 0.2 MG tablet Take 0.2 mg by mouth daily.   Yes [provider]  ibuprofen (ADVIL) 200 MG tablet Take 200 mg by mouth every 6 (six) hours as needed for mild pain (pain score 1-3).   Yes [provider]      Allergies    Patient has no known allergies.    Review of Systems   Review of Systems  Neurological:  Positive for weakness.    Physical Exam Updated Vital Signs BP (!) 153/105   Pulse 82   Temp 98.3 F (36.8 C) (Axillary)   Resp 20   Ht 5\' 6"  (1.676 m)   Wt 72.6 kg   LMP 05/25/2015   SpO2 97%   BMI 25.83 kg/m  Physical Exam Vitals and nursing note reviewed.  Constitutional:      General: She is not in acute distress.    Appearance: She is well-developed.  HENT:     Head: Normocephalic and atraumatic.      Mouth/Throat:     Mouth: Mucous membranes are moist.  Eyes:     Conjunctiva/sclera: Conjunctivae normal.  Cardiovascular:     Rate and Rhythm: Normal rate and regular rhythm.     Heart sounds: No murmur heard. Pulmonary:     Effort: Pulmonary effort is normal. No respiratory distress.     Breath sounds: Normal breath sounds.  Abdominal:     General: There is no distension.     Palpations: Abdomen is soft.     Tenderness: There is no abdominal tenderness.  Musculoskeletal:        General: No swelling. Normal range of motion.     Cervical back: Neck supple.  Skin:    General: Skin is warm and dry.     Capillary Refill: Capillary refill takes less than 2 seconds.  Neurological:     General: No focal deficit present.     Mental Status: She is alert and oriented to person, place, and time.     Sensory: No sensory deficit.     Motor: No weakness.  Psychiatric:        Mood and Affect: Mood normal.     ED Results / Procedures / Treatments   Labs (all labs ordered are listed, but  only abnormal results are displayed) Labs Reviewed  CBC WITH DIFFERENTIAL/PLATELET - Abnormal; Notable for the following components:      Result Value   Platelets 125 (*)    All other components within normal limits  COMPREHENSIVE METABOLIC PANEL - Abnormal; Notable for the following components:   Potassium 2.7 (*)    Chloride 97 (*)    Glucose, Bld 156 (*)    BUN <5 (*)    Calcium 7.7 (*)    Albumin 3.3 (*)    AST 190 (*)    ALT 52 (*)    Alkaline Phosphatase 159 (*)    Total Bilirubin 1.6 (*)    All other components within normal limits  MAGNESIUM - Abnormal; Notable for the following components:   Magnesium 1.3 (*)    All other components within normal limits  CBC - Abnormal; Notable for the following components:   WBC 3.7 (*)    Hemoglobin 11.5 (*)    HCT 34.2 (*)    Platelets 96 (*)    All other components within normal limits  BRAIN NATRIURETIC PEPTIDE - Abnormal; Notable for the  following components:   B Natriuretic Peptide 488.8 (*)    All other components within normal limits  CBG MONITORING, ED - Abnormal; Notable for the following components:   Glucose-Capillary 172 (*)    All other components within normal limits  I-STAT CHEM 8, ED - Abnormal; Notable for the following components:   Potassium 3.3 (*)    Chloride 96 (*)    BUN <3 (*)    Creatinine, Ser 0.40 (*)    Glucose, Bld 167 (*)    Calcium, Ion 1.11 (*)    All other components within normal limits  TROPONIN I (HIGH SENSITIVITY) - Abnormal; Notable for the following components:   Troponin I (High Sensitivity) 119 (*)    All other components within normal limits  TROPONIN I (HIGH SENSITIVITY) - Abnormal; Notable for the following components:   Troponin I (High Sensitivity) 113 (*)    All other components within normal limits  MRSA NEXT GEN BY PCR, NASAL  RESPIRATORY PANEL BY PCR  HIV ANTIBODY (ROUTINE TESTING W REFLEX)  CREATININE, SERUM  LIPOPROTEIN A (LPA)  BASIC METABOLIC PANEL  LIPID PANEL  CBC    EKG EKG Interpretation Date/Time:  Friday April 29 2023 13:47:19 EST Ventricular Rate:  60 PR Interval:  123 QRS Duration:  113 QT Interval:  681 QTC Calculation: 681 R Axis:   75  Text Interpretation: Sinus rhythm Multiple ventricular premature complexes Borderline intraventricular conduction delay Abnrm T, consider ischemia, anterolateral lds Prolonged QT interval Confirmed by Meridee Score [phone]) on 04/29/2023 1:49:59 PM  Radiology DG Chest Portable 1 View  Result Date: 04/29/2023 CLINICAL DATA:  Ventricular tachycardia. EXAM: PORTABLE CHEST 1 VIEW COMPARISON:  09/14/2021 FINDINGS: External pacer paddles are noted. The cardiac silhouette, mediastinal and hilar contours are within normal limits. Prominent perihilar interstitial markings suggesting mild perihilar interstitial edema. No pleural effusions. No pulmonary infiltrates. No pneumothorax. The bony thorax is intact.  IMPRESSION: Mild perihilar interstitial edema. Electronically Signed   By: Rudie Meyer M.D.   On: 04/29/2023 14:53    Procedures .Critical Care  Performed by: Ma Rings, PA-C Authorized by: Ma Rings, PA-C   Critical care provider statement:    Critical care time (minutes):  60   Critical care time was exclusive of:  Separately billable procedures and treating other patients   Critical care was necessary to treat  or prevent imminent or life-threatening deterioration of the following conditions:  Cardiac failure   Critical care was time spent personally by me on the following activities:  Obtaining history from patient or surrogate, evaluation of patient's response to treatment, discussions with consultants, ordering and review of laboratory studies, pulse oximetry, re-evaluation of patient's condition and review of old charts   Care discussed with: admitting provider       Medications Ordered in ED Medications  lidocaine (XYLOCAINE) 4 mg/mL bolus via infusion 100 mg (100 mg Intravenous Bolus from Bag 04/29/23 1454)    And  lidocaine (cardiac) 2000 mg in dextrose 5% 500 mL (4mg /mL) IV infusion (2 mg/min Intravenous Infusion Verify 04/29/23 2200)  aspirin EC tablet 81 mg (has no administration in time range)  nitroGLYCERIN (NITROSTAT) SL tablet 0.4 mg (has no administration in time range)  acetaminophen (TYLENOL) tablet 650 mg (has no administration in time range)  ondansetron (ZOFRAN) injection 4 mg (has no administration in time range)  enoxaparin (LOVENOX) injection 40 mg (has no administration in time range)  Oral care mouth rinse (has no administration in time range)  Chlorhexidine Gluconate Cloth 2 % PADS 6 each (6 each Topical Given 04/29/23 1813)  calcium gluconate 10 % injection (  Given 04/29/23 1330)  amiodarone (NEXTERONE) 1.5 mg/mL IV bolus only (0 mg  Stopped 04/29/23 1403)  magnesium sulfate IVPB 2 g 50 mL (0 g Intravenous Stopped 04/29/23 1455)   potassium chloride 10 mEq in 100 mL IVPB ( Intravenous Infusion Verify 04/29/23 2200)    ED Course/ Medical Decision Making/ A&P Clinical Course as of 04/29/23 1404  Fri Apr 29, 2023  1348 Patient presented with weak and dizzy.  History of hypertension.  EKG looked suspicious with peaked T waves.  Apparently patient went into V. tach and became unresponsive in the room.  She is back in sinus but went into possible V. tach again with unresponsive.  She was shocked.  She is getting calcium magnesium amiodarone and cardiology has been paged.  Anticipate will need admission to [facility] for further evaluation. [MB]  1402 Patient is brought to the ER by family today for dizziness with nausea and vomiting. She states she felt normal last night but started feeling poorly around 6 AM this morning shortly after waking up.  She is alert and oriented presenting to the ER about shivering, states she felt cold.  EKG had prolonged QT with peaked T waves and given a gram of mag sulfate while i-STAT was trying and IV had been obtained.  Patient went into V. tach on the room became in consult, went back to normal, ED attending was room at this time as above, patient put on pants and went back into V. tach, was shocked, getting magnesium and initially given amiodarone bolus but cardiology was consulted and patient was seen at the bedside by Dr. Ancil Boozer.  She requested to be do not give the amnio drip due to possible prolonged QT with this.  She would like to consider possible lidocaine infusion.  ICU consulted at this time.  Patient's daughter now at bedside states she did similar episode last year.  States she was in the ICU in [facility] [CB]    Clinical Course User Index [CB] Ma Rings, PA-C [MB] Terrilee Files, MD                                 Medical Decision  Making This patient presents to the ED for concern of dizziness, nausea and vomiting, this involves an extensive number of treatment  options, and is a complaint that carries with it a high risk of complications and morbidity.  The differential diagnosis includes electrolyte abnormality, arrhythmia, ACS, dehydration, PE, other   Co morbidities that complicate the patient evaluation :   Hypertension on chlorthalidone   Additional history obtained:  Additional history obtained from EMR External records from outside source obtained and reviewed including notes   Lab Tests:  I Ordered, and personally interpreted labs.  The pertinent results include: I-STAT potassium 3.3, current CMP shows potassium of 2.7, no hypoglycemia, no renal dysfunction, hypocalcemia that is mild at 7.7, AST ALT alk phos and bilirubin are all slightly elevated as well.  Thrombocytopenic with platelets at 125, troponin elevated at 119   Imaging Studies ordered:  I ordered imaging studies including chest x-ray which shows mild perihilar interstitial edema I independently visualized and interpreted imaging within scope of identifying emergent findings  I agree with the radiologist interpretation   Cardiac Monitoring: / EKG:  The patient was maintained on a cardiac monitor.  I personally viewed and interpreted the cardiac monitored which showed an underlying rhythm of: T prolongation with peaked T waves, subsequently noted to be in V. tach, did have some polymorphic V. Tach, and sinus rhythm   Consultations Obtained:  I requested consultation with the cardiologist Dr. Jenene Slicker,  and discussed lab and imaging findings as well as pertinent plan - they recommend: The lidocaine per pharmacy consult, do not give amiodarone infusion.  Consult with ICU for admission,.  Discussed with Dr. Annia Friendly from ICU, who states he like to speak with cardiology to see if they will manage patient as he does not feel that he will be able to anything to patient's management of IV lidocaine management for recurrent V. tach.   Problem List / ED Course / Critical  interventions / Medication management  Recurrent V. Tach-patient has prolonged QTc, also noted to have hypokalemia which is being repleted, also giving magnesium and calcium.  Initial troponin elevated at 119, waiting on repeat.  Elevated LFTs and thrombocytopenia noted to be similar in the past and previously thought to be due to alcohol abuse.  Lidocaine ordered per pharmacy consult.  With the intensivist who is also consulting cardiology, and awaiting callback.  Ultimately Dr. Ancil Boozer is excepting admission will put in orders for admission to cardiology service at [facility]  I ordered medication including IV magnesium, calcium, amiodarone, lidocaine for tach Reevaluation of the patient after these medicines showed that the patient improved I have reviewed the patients home medicines and have made adjustments as needed   Social Determinants of Health:  Daily alcohol use, occasional marijuana use       Amount and/or Complexity of Data Reviewed Labs: ordered. Radiology: ordered. ECG/medicine tests: ordered.  Risk Prescription drug management. Decision regarding hospitalization.           Final Clinical Impression(s) / ED Diagnoses Final diagnoses:  Ventricular tachycardia [facility])    Rx / DC Orders ED Discharge Orders     None         Josem Kaufmann 04/29/23 1515    Terrilee Files, MD 04/29/23 1832    Carmel Sacramento A, PA-C 04/29/23 2310    Terrilee Files, MD 05/01/23 [phone]

## 2023-04-29 NOTE — ED Notes (Signed)
Date and time results received: 04/29/23 1425 (use smartphrase ".now" to insert current time)  Test: potassium 2.7, troponin 119 Critical Value: see above  Name of Provider Notified: Dr Charm Barges  Orders Received? Or Actions Taken?: Orders Received - See Orders for details

## 2023-04-29 NOTE — H&P (Addendum)
CARDIOLOGY CONSULT NOTE    Patient ID: Erika Kelley; [id number]; May 06, 1971   Admit date: 04/29/2023 Date of Consult: 04/29/2023  Primary Care Provider: [facility] Primary Cardiologist:  Primary Electrophysiologist:     History of Present Illness:   Erika Kelley is a 52 y/o F known to have HTN, ??seizure disorder (history phenobarbital use in the past) was brought to the ER after passing out in the car.  History is obtained from the daughter and the patient.  Daughter reported that she was driving when she noticed her mother slowly sliding down in the passenger seat and eventually lost consciousness.  She immediately gave her chest thump and brought her to the ER.  Upon arrival to the ER, she went into V. tach (no strips available at this time), underwent CPR for a few seconds and patient spontaneously converted to normal sinus rhythm. Thereafter, within a few minutes, she had recurrent V. tach episode (EKG strip showed polymorphic VT consistent with torsades), received 1 shock 120 J and converted normal sinus rhythm. Started on amiodarone bolus. Electrolytes repleted, potassium, calcium and magnesium.  Patient alert and oriented x 3 after shock was administered.  She denies having any chest pain.  She is resting and is asking for water.  Per patient and her daughter, currently she is only taking chlorthalidone, not eating enough meals daily.  She had syncope last year and was taken to [facility], she has a different chart, not merged yet.  Patient's daughter also reported that she was admitted to [facility] last year on a different occasion for possible seizure-like activity and was started on phenobarbital but this was taken off a while ago.  Patient's son passed away in sleep last year.  No known history of long QT syndrome.  Past Medical History:  Diagnosis Date   Hypertension     Past Surgical History:  Procedure Laterality Date   CESAREAN  SECTION        Inpatient Medications: Scheduled Meds:  Continuous Infusions:  lidocaine 2 mg/min (04/29/23 1453)   potassium chloride 10 mEq (04/29/23 1454)   PRN Meds:   Allergies:   No Known Allergies  Social History:   Social History   Socioeconomic History   Marital status: Single    Spouse name: Not on file   Number of children: Not on file   Years of education: Not on file   Highest education level: Not on file  Occupational History   Not on file  Tobacco Use   Smoking status: Every Day    Current packs/day: 0.50    Types: Cigarettes   Smokeless tobacco: Never  Vaping Use   Vaping status: Never Used  Substance and Sexual Activity   Alcohol use: Yes    Comment: occ   Drug use: No   Sexual activity: Not on file  Other Topics Concern   Not on file  Social History Narrative   Not on file   Social Determinants of Health   Financial Resource Strain: Not on file  Food Insecurity: Not on file  Transportation Needs: Not on file  Physical Activity: Not on file  Stress: Not on file  Social Connections: Not on file  Intimate Partner Violence: Not on file    Family History:    Family History  Problem Relation Age of Onset   Stroke Mother      ROS:  Please see the history of present illness.  ROS  All other ROS reviewed and  negative.     Physical Exam/Data:   Vitals:   04/29/23 1309 04/29/23 1314 04/29/23 1400 04/29/23 1445  BP: (!) 188/102 (!) 145/96 (!) 123/92 119/82  Pulse: (!) 33 (!) 54 72 (!) 56  Resp: 10 12 15 16   Temp:  97.8 F (36.6 C)    SpO2: (!) 85% 97% 97% 95%  Weight:   72.6 kg     Intake/Output Summary (Last 24 hours) at 04/29/2023 1505 Last data filed at 04/29/2023 1455 Gross per 24 hour  Intake 137.24 ml  Output --  Net 137.24 ml   Filed Weights   04/29/23 1400  Weight: 72.6 kg   Body mass index is 25.83 kg/m.  General:  Well nourished, well developed, in no acute distress HEENT: normal Lymph: no adenopathy Neck:  no JVD Endocrine:  No thryomegaly Vascular: No carotid bruits; FA pulses 2+ bilaterally without bruits  Cardiac:  normal S1, S2; RRR; no murmur  Lungs:  clear to auscultation bilaterally, no wheezing, rhonchi or rales  Abd: soft, nontender, no hepatomegaly  Ext: no edema Musculoskeletal:  No deformities, BUE and BLE strength normal and equal Skin: warm and dry  Neuro:  CNs 2-12 intact, no focal abnormalities noted Psych:  Normal affect   EKG:  The EKG was personally reviewed and demonstrates:   Telemetry:  Telemetry was personally reviewed and demonstrates:    Relevant CV Studies:   Laboratory Data:  Chemistry Recent Labs  Lab 04/29/23 1335 04/29/23 1341  NA 135 139  K 2.7* 3.3*  CL 97* 96*  CO2 25  --   GLUCOSE 156* 167*  BUN <5* <3*  CREATININE 0.49 0.40*  CALCIUM 7.7*  --   GFRNONAA >60  --   ANIONGAP 13  --     Recent Labs  Lab 04/29/23 1335  PROT 7.5  ALBUMIN 3.3*  AST 190*  ALT 52*  ALKPHOS 159*  BILITOT 1.6*   Hematology Recent Labs  Lab 04/29/23 1335 04/29/23 1341  WBC 5.5  --   RBC 4.59  --   HGB 13.2 14.6  HCT 40.3 43.0  MCV 87.8  --   MCH 28.8  --   MCHC 32.8  --   RDW 14.6  --   PLT 125*  --    Cardiac EnzymesNo results for input(s): "TROPONINI" in the last 168 hours. No results for input(s): "TROPIPOC" in the last 168 hours.  BNPNo results for input(s): "BNP", "PROBNP" in the last 168 hours.  DDimer No results for input(s): "DDIMER" in the last 168 hours.  Radiology/Studies:  DG Chest Portable 1 View  Result Date: 04/29/2023 CLINICAL DATA:  Ventricular tachycardia. EXAM: PORTABLE CHEST 1 VIEW COMPARISON:  09/14/2021 FINDINGS: External pacer paddles are noted. The cardiac silhouette, mediastinal and hilar contours are within normal limits. Prominent perihilar interstitial markings suggesting mild perihilar interstitial edema. No pleural effusions. No pulmonary infiltrates. No pneumothorax. The bony thorax is intact. IMPRESSION: Mild  perihilar interstitial edema. Electronically Signed   By: Rudie Meyer M.D.   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 Cardiac syncope Severe hypokalemia Severe hypomagnesemia Hypocalcemia Transaminitis Thrombocytopenia   -Presented with cardiac syncope, 2 episodes of V. Tach (underwent CPR and spontaneously converted to NSR for the first episode, underwent 1 shock 120 J for the second episode and converted to NSR). EKG strips reviewed, consistent with PMVT.  Baseline EKG showed  normal sinus rhythm, prolonged QTc interval 564 ms.  Prior EKGs from 2023 and 2022 reviewed which shows baseline prolonged QTc interval.  Underlying electrolyte derangements, severe hypokalemia, severe hypomagnesemia and hypocalcemia might have exacerbated baseline QTc prolongation and might have caused torsades.  However long QT syndrome will need to be ruled out due to prior EKG showing prolonged QTc interval and history of patient's son passing away in sleep last year.  Stop amiodarone bolus due to prolonged QTc, start lidocaine bolus and infusion.  Aggressive repletion of electrolytes, keep K>4 and <5, Mg>2 and <3, Ca >9 and <11.  Discontinue chlorthalidone (only home medication).  She will need to be transferred to [facility] for higher level of care and admitted under cardiology service, ICU bed.  EP team will need to be consulted. Obtain 2D echocardiogram. Outpatient testing for long QT syndrome. Hold beta-blockers. -Troponin elevation likely secondary to CPR. Patient denies chest pain.    For questions or updates, please contact [facility] Please consult [email or web address] for contact info under Cardiology/STEMI.   Signed, Herbert Deaner, MD 04/29/2023 3:05 PM

## 2023-04-30 ENCOUNTER — Inpatient Hospital Stay (HOSPITAL_COMMUNITY): Payer: Commercial Managed Care - HMO

## 2023-04-30 ENCOUNTER — Other Ambulatory Visit: Payer: Self-pay

## 2023-04-30 ENCOUNTER — Encounter (HOSPITAL_COMMUNITY): Payer: Self-pay | Admitting: Internal Medicine

## 2023-04-30 DIAGNOSIS — J9601 Acute respiratory failure with hypoxia: Secondary | ICD-10-CM

## 2023-04-30 DIAGNOSIS — I469 Cardiac arrest, cause unspecified: Secondary | ICD-10-CM

## 2023-04-30 DIAGNOSIS — E876 Hypokalemia: Secondary | ICD-10-CM

## 2023-04-30 DIAGNOSIS — I5021 Acute systolic (congestive) heart failure: Secondary | ICD-10-CM

## 2023-04-30 DIAGNOSIS — I472 Ventricular tachycardia, unspecified: Secondary | ICD-10-CM

## 2023-04-30 LAB — RAPID URINE DRUG SCREEN, HOSP PERFORMED
Amphetamines: NOT DETECTED
Barbiturates: NOT DETECTED
Benzodiazepines: POSITIVE — AB
Cocaine: NOT DETECTED
Opiates: NOT DETECTED
Tetrahydrocannabinol: NOT DETECTED

## 2023-04-30 LAB — HEPATIC FUNCTION PANEL
ALT: 48 U/L — ABNORMAL HIGH (ref 0–44)
AST: 140 U/L — ABNORMAL HIGH (ref 15–41)
Albumin: 3 g/dL — ABNORMAL LOW (ref 3.5–5.0)
Alkaline Phosphatase: 146 U/L — ABNORMAL HIGH (ref 38–126)
Bilirubin, Direct: 0.9 mg/dL — ABNORMAL HIGH (ref 0.0–0.2)
Indirect Bilirubin: 1.2 mg/dL — ABNORMAL HIGH (ref 0.3–0.9)
Total Bilirubin: 2.1 mg/dL — ABNORMAL HIGH (ref <1.2)
Total Protein: 7.5 g/dL (ref 6.5–8.1)

## 2023-04-30 LAB — BASIC METABOLIC PANEL
Anion gap: 11 (ref 5–15)
Anion gap: 11 (ref 5–15)
BUN: <5 mg/dL — ABNORMAL LOW (ref 6–20)
BUN: <5 mg/dL — ABNORMAL LOW (ref 6–20)
CO2: 25 mmol/L (ref 22–32)
CO2: 30 mmol/L (ref 22–32)
Calcium: 8.3 mg/dL — ABNORMAL LOW (ref 8.9–10.3)
Calcium: 8.7 mg/dL — ABNORMAL LOW (ref 8.9–10.3)
Chloride: 99 mmol/L (ref 98–111)
Chloride: 96 mmol/L — ABNORMAL LOW (ref 98–111)
Creatinine, Ser: 0.55 mg/dL (ref 0.44–1.00)
Creatinine, Ser: 0.47 mg/dL (ref 0.44–1.00)
GFR, Estimated: >60 mL/min (ref >60)
GFR, Estimated: >60 mL/min (ref >60)
Glucose, Bld: 121 mg/dL — ABNORMAL HIGH (ref 70–99)
Glucose, Bld: 106 mg/dL — ABNORMAL HIGH (ref 70–99)
Potassium: 3.3 mmol/L — ABNORMAL LOW (ref 3.5–5.1)
Potassium: 4.1 mmol/L (ref 3.5–5.1)
Sodium: 135 mmol/L (ref 135–145)
Sodium: 137 mmol/L (ref 135–145)

## 2023-04-30 LAB — ECHOCARDIOGRAM COMPLETE
AR max vel: 3.37 cm2
AV Peak grad: 2.3 mm[Hg]
Ao pk vel: 0.76 m/s
Area-P 1/2: 5.02 cm2
Calc EF: 36 %
Height: 66 in
MV M vel: 5.05 m/s
MV Peak grad: 102 mm[Hg]
S' Lateral: 3.9 cm
Single Plane A2C EF: 39.4 %
Single Plane A4C EF: 35.2 %
Weight: 2560.86 [oz_av]

## 2023-04-30 LAB — C DIFFICILE QUICK SCREEN W PCR REFLEX
C Diff antigen: NEGATIVE
C Diff interpretation: NOT DETECTED
C Diff toxin: NEGATIVE

## 2023-04-30 LAB — CBC
HCT: 36.4 % (ref 36.0–46.0)
Hemoglobin: 12.2 g/dL (ref 12.0–15.0)
MCH: 28.2 pg (ref 26.0–34.0)
MCHC: 33.5 g/dL (ref 30.0–36.0)
MCV: 84.1 fL (ref 80.0–100.0)
Platelets: 110 10*3/uL — ABNORMAL LOW (ref 150–400)
RBC: 4.33 MIL/uL (ref 3.87–5.11)
RDW: 13.8 % (ref 11.5–15.5)
WBC: 4.6 10*3/uL (ref 4.0–10.5)
nRBC: 0 % (ref 0.0–0.2)

## 2023-04-30 LAB — LIPID PANEL
Cholesterol: 132 mg/dL (ref 0–200)
HDL: 75 mg/dL (ref >40)
LDL Cholesterol: 47 mg/dL (ref 0–99)
Total CHOL/HDL Ratio: 1.8 {ratio}
Triglycerides: 51 mg/dL (ref <150)
VLDL: 10 mg/dL (ref 0–40)

## 2023-04-30 LAB — MAGNESIUM
Magnesium: 1.6 mg/dL — ABNORMAL LOW (ref 1.7–2.4)
Magnesium: 1.6 mg/dL — ABNORMAL LOW (ref 1.7–2.4)
Magnesium: 2.4 mg/dL (ref 1.7–2.4)

## 2023-04-30 LAB — PHOSPHORUS: Phosphorus: 4.2 mg/dL (ref 2.5–4.6)

## 2023-04-30 MED ORDER — LORAZEPAM 2 MG/ML IJ SOLN
1.0000 mg | INTRAMUSCULAR | Status: AC | PRN
  Administered 2023-04-30 (×4): 2 mg via INTRAVENOUS
  Filled 2023-04-30 (×5): qty 1

## 2023-04-30 MED ORDER — METHYLPREDNISOLONE SODIUM SUCC 40 MG IJ SOLR
40.0000 mg | INTRAMUSCULAR | Status: DC
  Administered 2023-04-30 – 2023-05-01 (×2): 40 mg via INTRAVENOUS
  Filled 2023-04-30 (×2): qty 1

## 2023-04-30 MED ORDER — LORAZEPAM 1 MG PO TABS
1.0000 mg | ORAL_TABLET | ORAL | Status: AC | PRN
  Administered 2023-04-30: 1 mg via ORAL
  Filled 2023-04-30: qty 1

## 2023-04-30 MED ORDER — ADULT MULTIVITAMIN W/MINERALS CH
1.0000 | ORAL_TABLET | Freq: Every day | ORAL | Status: DC
  Administered 2023-04-30 – 2023-05-03 (×4): 1 via ORAL
  Filled 2023-04-30 (×4): qty 1

## 2023-04-30 MED ORDER — CALCIUM GLUCONATE-NACL 1-0.675 GM/50ML-% IV SOLN
1.0000 g | Freq: Once | INTRAVENOUS | Status: AC
  Administered 2023-04-30: 1000 mg via INTRAVENOUS
  Filled 2023-04-30: qty 50

## 2023-04-30 MED ORDER — IPRATROPIUM-ALBUTEROL 0.5-2.5 (3) MG/3ML IN SOLN
3.0000 mL | RESPIRATORY_TRACT | Status: DC
  Administered 2023-04-30 – 2023-05-01 (×7): 3 mL via RESPIRATORY_TRACT
  Filled 2023-04-30 (×6): qty 3
  Filled 2023-04-30: qty 63
  Filled 2023-04-30: qty 3

## 2023-04-30 MED ORDER — SODIUM CHLORIDE 0.9% FLUSH
10.0000 mL | Freq: Two times a day (BID) | INTRAVENOUS | Status: DC
  Administered 2023-04-30: 10 mL
  Administered 2023-05-01: 20 mL
  Administered 2023-05-01 – 2023-05-02 (×2): 10 mL
  Administered 2023-05-03: 20 mL

## 2023-04-30 MED ORDER — POTASSIUM CHLORIDE CRYS ER 20 MEQ PO TBCR
40.0000 meq | EXTENDED_RELEASE_TABLET | Freq: Once | ORAL | Status: AC
  Administered 2023-04-30: 40 meq via ORAL
  Filled 2023-04-30: qty 2

## 2023-04-30 MED ORDER — MAGNESIUM SULFATE 4 GM/100ML IV SOLN
4.0000 g | Freq: Once | INTRAVENOUS | Status: AC
  Administered 2023-04-30: 4 g via INTRAVENOUS
  Filled 2023-04-30: qty 100

## 2023-04-30 MED ORDER — ENSURE ENLIVE PO LIQD
237.0000 mL | Freq: Two times a day (BID) | ORAL | Status: DC
  Administered 2023-05-01 – 2023-05-03 (×5): 237 mL via ORAL

## 2023-04-30 MED ORDER — SODIUM CHLORIDE 0.9% FLUSH: 10.0000 mL | INTRAVENOUS | Status: DC | PRN

## 2023-04-30 MED ORDER — THIAMINE HCL 100 MG/ML IJ SOLN
100.0000 mg | Freq: Every day | INTRAMUSCULAR | Status: DC
  Administered 2023-05-01: 100 mg via INTRAVENOUS
  Filled 2023-04-30: qty 2

## 2023-04-30 MED ORDER — SODIUM CHLORIDE 0.9 % IV SOLN
2.0000 g | INTRAVENOUS | Status: DC
  Administered 2023-04-30 – 2023-05-02 (×3): 2 g via INTRAVENOUS
  Filled 2023-04-30 (×3): qty 20

## 2023-04-30 MED ORDER — HYDRALAZINE HCL 20 MG/ML IJ SOLN
5.0000 mg | Freq: Four times a day (QID) | INTRAMUSCULAR | Status: DC | PRN
  Administered 2023-04-30: 5 mg via INTRAVENOUS
  Filled 2023-04-30: qty 1

## 2023-04-30 MED ORDER — THIAMINE MONONITRATE 100 MG PO TABS
100.0000 mg | ORAL_TABLET | Freq: Every day | ORAL | Status: DC
  Administered 2023-04-30 – 2023-05-03 (×3): 100 mg via ORAL
  Filled 2023-04-30 (×4): qty 1

## 2023-04-30 MED ORDER — POTASSIUM CHLORIDE 10 MEQ/100ML IV SOLN
10.0000 meq | INTRAVENOUS | Status: AC
  Administered 2023-04-30 (×6): 10 meq via INTRAVENOUS
  Filled 2023-04-30 (×6): qty 100

## 2023-04-30 MED ORDER — FUROSEMIDE 10 MG/ML IJ SOLN
INTRAMUSCULAR | Status: AC
  Administered 2023-04-30: 40 mg
  Filled 2023-04-30: qty 4

## 2023-04-30 MED ORDER — DOXYCYCLINE HYCLATE 100 MG IV SOLR
100.0000 mg | Freq: Two times a day (BID) | INTRAVENOUS | Status: DC
  Administered 2023-04-30 – 2023-05-03 (×6): 100 mg via INTRAVENOUS
  Filled 2023-04-30 (×7): qty 100

## 2023-04-30 MED ORDER — FUROSEMIDE 10 MG/ML IJ SOLN: 40.0000 mg | Freq: Once | INTRAMUSCULAR | Status: DC

## 2023-04-30 MED ORDER — NADOLOL 20 MG PO TABS
20.0000 mg | ORAL_TABLET | Freq: Every day | ORAL | Status: DC
  Administered 2023-04-30 – 2023-05-03 (×4): 20 mg via ORAL
  Filled 2023-04-30 (×4): qty 1

## 2023-04-30 MED ORDER — FOLIC ACID 1 MG PO TABS
1.0000 mg | ORAL_TABLET | Freq: Every day | ORAL | Status: DC
  Administered 2023-04-30 – 2023-05-03 (×4): 1 mg via ORAL
  Filled 2023-04-30 (×4): qty 1

## 2023-04-30 NOTE — Progress Notes (Signed)
Peripherally Inserted Central Catheter Placement  The IV Nurse has discussed with the patient and/or persons authorized to consent for the patient, the purpose of this procedure and the potential benefits and risks involved with this procedure.  The benefits include less needle sticks, lab draws from the catheter, and the patient may be discharged home with the catheter. Risks include, but not limited to, infection, bleeding, blood clot (thrombus formation), and puncture of an artery; nerve damage and irregular heartbeat and possibility to perform a PICC exchange if needed/ordered by physician.  Alternatives to this procedure were also discussed.  Bard Power PICC patient education guide, fact sheet on infection prevention and patient information card has been provided to patient /or left at bedside. Consent obtained by daughter via telephone.   PICC Placement Documentation  PICC Double Lumen 04/30/23 Right Basilic 39 cm 0 cm (Active)  Indication for Insertion or Continuance of Line Vasoactive infusions 04/30/23 2220  Exposed Catheter (cm) 0 cm 04/30/23 2220  Site Assessment Clean, Dry, Intact 04/30/23 2220  Lumen #1 Status Flushed;Saline locked;Blood return noted 04/30/23 2220  Lumen #2 Status Flushed;Saline locked;Blood return noted 04/30/23 2220  Dressing Type Transparent;Securing device 04/30/23 2220  Dressing Status Antimicrobial disc in place 04/30/23 2220  Line Care Connections checked and tightened 04/30/23 2220  Line Adjustment (NICU/IV Team Only) No 04/30/23 2220  Dressing Intervention New dressing;Adhesive placed at insertion site (IV team only) 04/30/23 2220  Dressing Change Due 05/07/23 04/30/23 2220       Elenore Paddy 04/30/2023, 10:22 PM

## 2023-04-30 NOTE — Progress Notes (Signed)
Echocardiogram 2D Echocardiogram has been performed.  Sausha Raymond N Ayame Rena,RDCS 04/30/2023, 12:09 PM

## 2023-04-30 NOTE — Progress Notes (Signed)
Progress Note  Patient Name: Erika Kelley Date of Encounter: 04/30/2023  Primary Cardiologist: None   Subjective   Feels better. Denies having any recent GI issues. Has been drinking heavily, a bottle of vodka daily and not eating.  Inpatient Medications    Scheduled Meds:  aspirin EC  81 mg Oral Daily   Chlorhexidine Gluconate Cloth  6 each Topical Daily   enoxaparin (LOVENOX) injection  40 mg Subcutaneous Q24H   folic acid  1 mg Oral Daily   multivitamin with minerals  1 tablet Oral Daily   thiamine  100 mg Oral Daily   Or   thiamine  100 mg Intravenous Daily   Continuous Infusions:  lidocaine 2 mg/min (04/30/23 0618)   potassium chloride 10 mEq (04/30/23 0702)   PRN Meds: acetaminophen, LORazepam **OR** LORazepam, nitroGLYCERIN, ondansetron (ZOFRAN) IV, mouth rinse   Vital Signs    Vitals:   04/30/23 0355 04/30/23 0400 04/30/23 0500 04/30/23 0600  BP: (!) 144/106 (!) 144/106 (!) 136/108 (!) 142/97  Pulse: 97 97 88 87  Resp:  15 14 16   Temp:      TempSrc:      SpO2:  97% 97% (!) 89%  Weight:      Height:        Intake/Output Summary (Last 24 hours) at 04/30/2023 0721 Last data filed at 04/30/2023 0600 Gross per 24 hour  Intake 1074.66 ml  Output 250 ml  Net 824.66 ml   Filed Weights   04/29/23 1400  Weight: 72.6 kg    Telemetry    Sinus rhythm and sinus tachycardia. PVCs present on admission have largely resolved - Personally Reviewed  ECG    Sinus rhythm with prolonged QT - Personally Reviewed  Physical Exam   GEN: No acute distress but appears anxious.   Neck: No JVD Cardiac: RRR, no murmurs, rubs, or gallops.  Respiratory: Clear to auscultation bilaterally. GI: Soft, nontender, non-distended  MS: No edema; No deformity. Neuro:  Nonfocal  Psych: Normal affect   Labs    Chemistry Recent Labs  Lab 04/29/23 1335 04/29/23 1341 04/29/23 1922 04/30/23 0249  NA 135 139  --  135  K 2.7* 3.3*  --  3.3*  CL 97* 96*  --  99   CO2 25  --   --  25  GLUCOSE 156* 167*  --  121*  BUN <5* <3*  --  <5*  CREATININE 0.49 0.40* 0.49 0.55  CALCIUM 7.7*  --   --  8.3*  PROT 7.5  --   --   --   ALBUMIN 3.3*  --   --   --   AST 190*  --   --   --   ALT 52*  --   --   --   ALKPHOS 159*  --   --   --   BILITOT 1.6*  --   --   --   GFRNONAA >60  --  >60 >60  ANIONGAP 13  --   --  11     Hematology Recent Labs  Lab 04/29/23 1335 04/29/23 1341 04/29/23 1922 04/30/23 0249  WBC 5.5  --  3.7* 4.6  RBC 4.59  --  4.08 4.33  HGB 13.2 14.6 11.5* 12.2  HCT 40.3 43.0 34.2* 36.4  MCV 87.8  --  83.8 84.1  MCH 28.8  --  28.2 28.2  MCHC 32.8  --  33.6 33.5  RDW 14.6  --  13.9 13.8  PLT 125*  --  96* 110*    Cardiac EnzymesNo results for input(s): "TROPONINI" in the last 168 hours. No results for input(s): "TROPIPOC" in the last 168 hours.   BNP Recent Labs  Lab 04/29/23 1922  BNP 488.8*     DDimer No results for input(s): "DDIMER" in the last 168 hours.   Summary of Pertinent studies    TTE: ordered  Imaging: CXR reviewed -- mild perihilar edema  Labs: reviewed  Patient Profile     52 y.o. female With recurrent ECGs demonstrating prolonged QT. During prior visits potassium has consistently been low. She is currently admitted with TdP in the setting of severe Mg and K depletion due to malnutrition and alcohol abuse.  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 Review of her K+ levels shows that they are consistently very low Patient's son died in his sleep, which is not typical of LQT 1 but more associated with Brugada and LQT 3 Wean lidocaine today Would start nadalol TTE pending  Hypokalemia, Hypomagnesemia Due to malnutrition, chlorthalidone, alcohol abuse Replete aggressively  Alcohol abuse Now in alcohol withdrawal Continue CIWA  Thrombocytopenia, transaminitis Suspect  alcohol liver injury Hepatitis panel negative in 2023 Liver US 09/2021 showed increased echogenicity     For questions or updates, please contact [facility] Please consult [email or web address] for contact info under Cardiology/STEMI.      Signed, Maurice Small, MD 04/30/2023, 7:21 AM

## 2023-04-30 NOTE — Consult Note (Signed)
NAME:  Erika Kelley, MRN:  [id number], DOB:  27-Aug-1970, LOS: 1 ADMISSION DATE:  04/29/2023, CONSULTATION DATE:  11/23 REFERRING MD:  Mealor- Cardiology, CHIEF COMPLAINT:  respiratory failure  History of Present Illness:  Erika Kelley is a 52 y/o woman with a history of drinking a bottle of vodka per day, tobacco abuse, previous VT cardiac arrest who presented to the hospital with dizziness, vomiting, diaphoresis.  And transferred to the hospital she lost consciousness where her eyes rolled back in her head and she was able to give her a chest thump bringing her back to consciousness.  She has had a similar previous episode where she was admitted to the hospital in [place].  In the emergency room at [facility] she developed episodes of ventricular tachycardia, requiring cardioversion.  She was transferred to [facility] for cardiology evaluation.  Today she has developed worsened respiratory failure and agitation.  She drinks alcohol daily, especially over the last year since her son had a sudden cardiac arrest and died unexpectedly.  She smokes tobacco.  Pertinent  Medical History  ETOH abuse Tobacco abuse HTN VT w/ cardiac arrest, a/w hypokalemia  Significant Hospital Events: Including procedures, antibiotic start and stop dates in addition to other pertinent events   11/22 presented to [facility]  Interim History / Subjective:    Objective   Blood pressure (!) 160/118, pulse 84, temperature 98.6 F (37 C), temperature source Oral, resp. rate (!) 24, height 5\' 6"  (1.676 m), weight 72.6 kg, last menstrual period 05/25/2015, SpO2 100%.    Vent Mode: PCV;BIPAP FiO2 (%):  [40 %] 40 % Set Rate:  [14 bmp] 14 bmp PEEP:  [5 cmH20] 5 cmH20 Pressure Support:  [5 cmH20] 5 cmH20   Intake/Output Summary (Last 24 hours) at 04/30/2023 1722 Last data filed at 04/30/2023 1500 Gross per 24 hour  Intake 1499.25 ml  Output 1250 ml  Net 249.25 ml   Filed Weights   04/29/23 1400   Weight: 72.6 kg    Examination: General: ill appearing woman lying in bed in NAD HENT: /AT, eyes anicteric Lungs: Diffuse bilateral rhales, expiratory accessory muscle use, tachypnea.  Wet sounding cough.  More comfortable once she was placed on BiPAP with reduced work of breathing. Cardiovascular: S1S2, RRR. JVD to the angle of the mandible.  Abdomen: Abd soft, NT Extremities: no cyanosis or edema, minimal muscle mass Neuro: confused, sleeping> later awake GU: amber urine  K+ 3.3 BUN <5 Cr 0.47 Mg+ 1.6 T bili 2.1 AST 140 ALT 48  WBC 4.6 H/H 12.2/36.4 Platelets 110  Echo: LVEF 35-40%, moderately decreased function, G2DD. RV size and function normal.  Mildly dilated LA, moderate MR.  IVC dilated with reduced respiratory variability.  Resolved Hospital Problem list     Assessment & Plan:  Acute respiratory failure with hypoxia--concern this is multifactorial due to acute pulmonary edema and obstructive lung disease.  With her tobacco history she likely has COPD.  Cannot rule out pneumonia. - BiPAP - Continue diuresis - Starting steroids and bronchodilators - Empiric antibiotics for CAP-doxycycline and ceftriaxone  VT, likely due to hypokalemia and hypomagnesemia.  Previous history of VT for electrolyte issues causing cardiac arrest.  Has history of QTc prolongation, potentially related to electrolyte abnormalities but also does increase her risk of baseline channelopathy. 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 - Avoid electrolyte abnormalities - Monitor on telemetry - Needs to be started  on Entresto, spironolactone, Farxiga before discharge.  Due to potential need for intubation if respiratory failure worsens would avoid starting aggressive blood pressure lowering meds if she would require sedation on mechanical ventilation. - Defer to cardiology if ischemia evaluation is warranted prior to discharge  Agitation due to alcohol withdrawal versus respiratory failure - CIWA protocol - Support respiratory failure aggressively  Severe protein energy malnutrition - Needs to quit drinking - Encourage p.o. intake as able  Tobacco abuse - Recommend smoking cessation - Recommend outpatient referral for lung cancer screening  Chronic thrombocytopenia, likely due to alcohol abuse -No indication for transfusion, monitor  Elevated transaminases and hyperbilirubinemia-likely due to acute decompensated heart failure, although could have cirrhosis - Right upper quadrant ultrasound  Husband who is deaf has been updated bedside.  Her daughter and sister are at bedside and were also updated by EP and myself.  Best Practice (right click and "Reselect all SmartList Selections" daily)   Diet/type: Regular consistency (see orders) DVT prophylaxis: enoxaparin (LOVENOX) injection 40 mg Start: 04/30/23 1000  Pressure ulcer(s): not present on admission  GI prophylaxis: N/A Lines: N/A Foley:  Yes, and it is still needed Code Status:  full code Last date of multidisciplinary goals of care discussion [see above]  Labs   CBC: Recent Labs  Lab 04/29/23 1335 04/29/23 1341 04/29/23 1922 04/30/23 0249  WBC 5.5  --  3.7* 4.6  NEUTROABS 3.8  --   --   --   HGB 13.2 14.6 11.5* 12.2  HCT 40.3 43.0 34.2* 36.4  MCV 87.8  --  83.8 84.1  PLT 125*  --  96* 110*    Basic Metabolic Panel: Recent Labs  Lab 04/29/23 1335 04/29/23 1341 04/29/23 1402 04/29/23 1922 04/30/23 0249 04/30/23 1536  NA 135 139  --   --  135 137  K 2.7* 3.3*  --   --  3.3* 4.1  CL 97* 96*  --   --  99 96*  CO2 25   --   --   --  25 30  GLUCOSE 156* 167*  --   --  121* 106*  BUN <5* <3*  --   --  <5* <5*  CREATININE 0.49 0.40*  --  0.49 0.55 0.47  CALCIUM 7.7*  --   --   --  8.3* 8.7*  MG  --   --  1.3*  --  1.6* 2.4  PHOS  --   --   --   --   --  4.2   GFR: Estimated Creatinine Clearance: 85.8 mL/min (by C-G formula based on SCr of 0.47 mg/dL). Recent Labs  Lab 04/29/23 1335 04/29/23 1922 04/30/23 0249  WBC 5.5 3.7* 4.6    Liver Function Tests: Recent Labs  Lab 04/29/23 1335 04/30/23 0249  AST 190* 140*  ALT 52* 48*  ALKPHOS 159* 146*  BILITOT 1.6* 2.1*  PROT 7.5 7.5  ALBUMIN 3.3* 3.0*   No results for input(s): "LIPASE", "AMYLASE" in the last 168 hours. No results for input(s): "AMMONIA" in the last 168 hours.  ABG    Component Value Date/Time   PHART 7.18 (LL) 09/09/2021 1147   PCO2ART 50 (H) 09/09/2021 1147   PO2ART 107 09/09/2021 1147  HCO3 19.2 (L) 09/09/2021 1147   TCO2 27 04/29/2023 1341   ACIDBASEDEF 10.0 (H) 09/09/2021 1147   O2SAT 99.9 09/09/2021 1147     Coagulation Profile: No results for input(s): "INR", "PROTIME" in the last 168 hours.  Cardiac Enzymes: No results for input(s): "CKTOTAL", "CKMB", "CKMBINDEX", "TROPONINI" in the last 168 hours.  HbA1C: No results found for: "HGBA1C"  CBG: Recent Labs  Lab 04/29/23 1317  GLUCAP 172*    Review of Systems:   Limited due to respiratory failure  Past Medical History:  She,  has a past medical history of Alcohol abuse, Hypertension, and Ventricular tachycardia (HCC).   Surgical History:   Past Surgical History:  Procedure Laterality Date   CESAREAN SECTION       Social History:   reports that she has been smoking cigarettes. She has never used smokeless tobacco. She reports current alcohol use. She reports that she does not use drugs.   Family History:  Her family history includes Stroke in her mother; Sudden Cardiac Death in her son.   Allergies No Known Allergies   Home Medications   Prior to Admission medications   Medication Sig Start Date End Date Taking? Authorizing Provider  acetaminophen (TYLENOL) 500 MG tablet Take 500 mg by mouth every 6 (six) hours as needed for mild pain (pain score 1-3).   Yes [provider]  cloNIDine (CATAPRES) 0.2 MG tablet Take 0.2 mg by mouth daily.   Yes [provider]  ibuprofen (ADVIL) 200 MG tablet Take 200 mg by mouth every 6 (six) hours as needed for mild pain (pain score 1-3).   Yes [provider]     Critical care time: 45 min     Steffanie Dunn, DO 04/30/23 5:22 PM [facility]  For contact information, see Amion. If no response to pager, please call PCCM consult pager. After hours, 7PM- 7AM, please call Elink.

## 2023-04-30 NOTE — Progress Notes (Signed)
RT called by RN stating pt in distress and RT needed at bedside. RT at bedside to find pt on NRB with increased SOB/WOB. Per RT order, RT placed pt on BiPAP (servo-I). BS exp wheezing/coarse crackles. RT gave scheduled breathing tx as well. Pt VSS at this time w/improvement to WOB. RT will continue to monitor pt.

## 2023-04-30 NOTE — Progress Notes (Signed)
Patient began to develop signs of respiratory distress around 1200 with expiratory wheezing and adventitious breath sounds. This condition progressed to worse lung sounds with heavy anxiety, agitation, as well as JVD and O2 desaturations. Nasal cannula flow increased to 5L, then changed to non-re breather. 1 mg PO ativan given at 1216 and 2mg  IV ativan given at 1236 for management of withdrawal symptoms. Dr. Jenene Slicker was paged in relation to condition, received a verbal order for 40mg  lasix, see MAR with attached note on administration. Dr. Chestine Spore notified as she was on unit. Bipap started at 1345. Goal of managing withdrawal symptoms and respiratory support for remainder of shift and into night shift. Cultures taken to identify possibly source of infection resulting in respiratory complications.   Marletta Lor, RN  04/30/23

## 2023-05-01 ENCOUNTER — Inpatient Hospital Stay (HOSPITAL_COMMUNITY): Payer: Commercial Managed Care - HMO

## 2023-05-01 DIAGNOSIS — I472 Ventricular tachycardia, unspecified: Secondary | ICD-10-CM | POA: Diagnosis not present

## 2023-05-01 DIAGNOSIS — R9431 Abnormal electrocardiogram [ECG] [EKG]: Secondary | ICD-10-CM | POA: Diagnosis not present

## 2023-05-01 DIAGNOSIS — J441 Chronic obstructive pulmonary disease with (acute) exacerbation: Secondary | ICD-10-CM

## 2023-05-01 DIAGNOSIS — J9601 Acute respiratory failure with hypoxia: Secondary | ICD-10-CM | POA: Diagnosis not present

## 2023-05-01 LAB — BASIC METABOLIC PANEL
Anion gap: 6 (ref 5–15)
BUN: <5 mg/dL — ABNORMAL LOW (ref 6–20)
CO2: 29 mmol/L (ref 22–32)
Calcium: 8.1 mg/dL — ABNORMAL LOW (ref 8.9–10.3)
Chloride: 94 mmol/L — ABNORMAL LOW (ref 98–111)
Creatinine, Ser: 0.37 mg/dL — ABNORMAL LOW (ref 0.44–1.00)
GFR, Estimated: >60 mL/min (ref >60)
Glucose, Bld: 148 mg/dL — ABNORMAL HIGH (ref 70–99)
Potassium: 3.5 mmol/L (ref 3.5–5.1)
Sodium: 129 mmol/L — ABNORMAL LOW (ref 135–145)

## 2023-05-01 LAB — SARS CORONAVIRUS 2 (TAT 6-24 HRS): SARS Coronavirus 2: NEGATIVE

## 2023-05-01 LAB — CBC
HCT: 35.4 % — ABNORMAL LOW (ref 36.0–46.0)
Hemoglobin: 11.7 g/dL — ABNORMAL LOW (ref 12.0–15.0)
MCH: 28.7 pg (ref 26.0–34.0)
MCHC: 33.1 g/dL (ref 30.0–36.0)
MCV: 86.8 fL (ref 80.0–100.0)
Platelets: 86 10*3/uL — ABNORMAL LOW (ref 150–400)
RBC: 4.08 MIL/uL (ref 3.87–5.11)
RDW: 13.7 % (ref 11.5–15.5)
WBC: 3.8 10*3/uL — ABNORMAL LOW (ref 4.0–10.5)
nRBC: 0 % (ref 0.0–0.2)

## 2023-05-01 LAB — MAGNESIUM: Magnesium: 1.5 mg/dL — ABNORMAL LOW (ref 1.7–2.4)

## 2023-05-01 LAB — PHOSPHORUS: Phosphorus: 2.3 mg/dL — ABNORMAL LOW (ref 2.5–4.6)

## 2023-05-01 MED ORDER — ORAL CARE MOUTH RINSE: 15.0000 mL | OROMUCOSAL | Status: DC | PRN

## 2023-05-01 MED ORDER — SPIRONOLACTONE 25 MG PO TABS
25.0000 mg | ORAL_TABLET | Freq: Every day | ORAL | Status: DC
  Administered 2023-05-01 – 2023-05-03 (×3): 25 mg via ORAL
  Filled 2023-05-01 (×3): qty 1

## 2023-05-01 MED ORDER — ALBUTEROL SULFATE (2.5 MG/3ML) 0.083% IN NEBU
2.5000 mg | INHALATION_SOLUTION | Freq: Four times a day (QID) | RESPIRATORY_TRACT | Status: DC | PRN
  Administered 2023-05-02: 2.5 mg via RESPIRATORY_TRACT
  Filled 2023-05-01: qty 3

## 2023-05-01 MED ORDER — MAGNESIUM SULFATE 4 GM/100ML IV SOLN
4.0000 g | Freq: Once | INTRAVENOUS | Status: AC
  Administered 2023-05-01: 4 g via INTRAVENOUS
  Filled 2023-05-01: qty 100

## 2023-05-01 MED ORDER — POTASSIUM PHOSPHATES 15 MMOLE/5ML IV SOLN
15.0000 mmol | Freq: Once | INTRAVENOUS | Status: AC
  Administered 2023-05-01: 15 mmol via INTRAVENOUS
  Filled 2023-05-01: qty 5

## 2023-05-01 MED ORDER — POTASSIUM CHLORIDE CRYS ER 20 MEQ PO TBCR
40.0000 meq | EXTENDED_RELEASE_TABLET | Freq: Once | ORAL | Status: AC
Start: 2023-05-01 — End: 2023-05-01
  Administered 2023-05-01: 40 meq via ORAL
  Filled 2023-05-01: qty 2

## 2023-05-01 MED ORDER — POTASSIUM & SODIUM PHOSPHATES 280-160-250 MG PO PACK
2.0000 | PACK | Freq: Once | ORAL | Status: AC
Start: 2023-05-01 — End: 2023-05-01
  Administered 2023-05-01: 2 via ORAL
  Filled 2023-05-01: qty 2

## 2023-05-01 NOTE — Plan of Care (Signed)

## 2023-05-01 NOTE — Progress Notes (Signed)
NAME:  Erika Kelley, MRN:  [id number], DOB:  17-Aug-1970, LOS: 2 ADMISSION DATE:  04/29/2023, CONSULTATION DATE:  11/23 REFERRING MD:  Mealor- Cardiology, CHIEF COMPLAINT:  respiratory failure  History of Present Illness:  Erika Kelley is a 52 y/o woman with a history of drinking a bottle of vodka per day, tobacco abuse, previous VT cardiac arrest who presented to the hospital with dizziness, vomiting, diaphoresis.  And transferred to the hospital she lost consciousness where her eyes rolled back in her head and she was able to give her a chest thump bringing her back to consciousness.  She has had a similar previous episode where she was admitted to the hospital in [place].  In the emergency room at [facility] she developed episodes of ventricular tachycardia, requiring cardioversion.  She was transferred to [facility] for cardiology evaluation.  Today she has developed worsened respiratory failure and agitation.  She drinks alcohol daily, especially over the last year since her son had a sudden cardiac arrest and died unexpectedly.  She smokes tobacco.  Pertinent  Medical History  ETOH abuse Tobacco abuse HTN VT w/ cardiac arrest, a/w hypokalemia  Significant Hospital Events: Including procedures, antibiotic start and stop dates in addition to other pertinent events   11/22 presented to [facility]  Interim History / Subjective:    Objective   Blood pressure (!) 133/104, pulse 77, temperature 98 F (36.7 C), temperature source Axillary, resp. rate (!) 22, height 5\' 6"  (1.676 m), weight 72.6 kg, last menstrual period 05/25/2015, SpO2 100%.    Vent Mode: PSV;BIPAP FiO2 (%):  [40 %] 40 % Set Rate:  [14 bmp] 14 bmp PEEP:  [5 cmH20] 5 cmH20 Pressure Support:  [5 cmH20] 5 cmH20   Intake/Output Summary (Last 24 hours) at 05/01/2023 0804 Last data filed at 05/01/2023 0700 Gross per 24 hour  Intake 2105.95 ml  Output 2040 ml  Net 65.95 ml   Filed Weights   04/29/23 1400   Weight: 72.6 kg    Examination: General: chronically ill appearing woman sitting up in bed in NAD HENT: [place]/AT, eyes anicteric Neck: JVD Lungs: breathing comfortably on [place], let anterior rhales, now wheezing today Cardiovascular: S1S2, RRR Abdomen: abd soft, NT Extremities: no cyanosis or edema Neuro: awake, alert, answering questions appropriately Derm: warm, dry, no rashes  K+ 3.5 BUN <5 Cr 0.37 Mg+ 1.5  WBC 4.6 H/H 12.2/36.4 Platelets 110  Echo: LVEF 35-40%, moderately decreased function, G2DD. RV size and function normal.  Mildly dilated LA, moderate MR.  IVC dilated with reduced respiratory variability.  Resolved Hospital Problem list     Assessment & Plan:  Acute respiratory failure with hypoxia--concern this is multifactorial due to acute pulmonary edema and obstructive lung disease.  With her tobacco history she likely has COPD.  Cannot rule out pneumonia. - BiPAP PRN; wean supplemental O2 to maintain SpO3 >90% -diuresis - con't steroids, bronchodilators. Recommend discharge on LAMA/LABA & tobacco cessation - Con't empiric CAP antibiotics- doxycycline and ceftriaxone. Avoid Qtc prolonging meds (azithro)  VT, likely due to hypokalemia and hypomagnesemia.  Previous history of VT for electrolyte issues causing cardiac arrest.  Has history of QTc prolongation, potentially related to electrolyte abnormalities but also does increase her risk of baseline channelopathy. 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 Troponin elevation due to heart failure -start spironolactone today; will need to be started on  GDMT this admission - con't PICC for CVP & coox monitoring - tele monitoring - Defer to cardiology if ischemia evaluation is warranted prior to discharge  Hypervolemic hyponatremia -monitor  Hypophosphatemia Hypomagnesemia -replete, monitor  Agitation due to alcohol withdrawal versus respiratory failure -CIWA -vitamins -recommended total cessation  Severe protein energy malnutrition -encourage PO intake, Ensure  Tobacco abuse -smoking cessation recommended - Recommend outpatient referral for lung cancer screening  Chronic thrombocytopenia, likely due to alcohol abuse -No indication for transfusion, monitor  Elevated transaminases and hyperbilirubinemia-likely due to acute decompensated heart failure, although could have cirrhosis - Right upper quadrant ultrasound  Friend updated at bedside. No family at bedside today during rounds.   Best Practice (right click and "Reselect all SmartList Selections" daily)   Diet/type: Regular consistency (see orders) DVT prophylaxis: enoxaparin (LOVENOX) injection 40 mg Start: 04/30/23 1000  Pressure ulcer(s): not present on admission  GI prophylaxis: N/A Lines: Central line- picc Foley:  N/A Code Status:  full code Last date of multidisciplinary goals of care discussion [see above]  Labs   CBC: Recent Labs  Lab 04/29/23 1335 04/29/23 1341 04/29/23 1922 04/30/23 0249 05/01/23 0605  WBC 5.5  --  3.7* 4.6 3.8*  NEUTROABS 3.8  --   --   --   --   HGB 13.2 14.6 11.5* 12.2 11.7*  HCT 40.3 43.0 34.2* 36.4 35.4*  MCV 87.8  --  83.8 84.1 86.8  PLT 125*  --  96* 110* 86*    Basic Metabolic Panel: Recent Labs  Lab 04/29/23 1335 04/29/23 1341 04/29/23 1402 04/29/23 1922 04/30/23 0249 04/30/23 1536 05/01/23 0605  NA 135 139  --   --  135 137 129*  K 2.7* 3.3*  --   --  3.3* 4.1 3.5  CL 97* 96*  --   --  99 96* 94*  CO2 25  --   --   --  25 30 29   GLUCOSE 156* 167*  --   --  121* 106* 148*  BUN <5* <3*  --   --  <5* <5*  <5*  CREATININE 0.49 0.40*  --  0.49 0.55 0.47 0.37*  CALCIUM 7.7*  --   --   --  8.3* 8.7* 8.1*  MG  --   --  1.3*  --  1.6* 2.4 1.5*  PHOS  --   --   --   --   --  4.2 2.3*   GFR: Estimated Creatinine Clearance: 85.8 mL/min (A) (by C-G formula based on SCr of 0.37 mg/dL (L)). Recent Labs  Lab 04/29/23 1335 04/29/23 1922 04/30/23 0249 05/01/23 0605  WBC 5.5 3.7* 4.6 3.8*    Liver Function Tests: Recent Labs  Lab 04/29/23 1335 04/30/23 0249  AST 190* 140*  ALT 52* 48*  ALKPHOS 159* 146*  BILITOT 1.6* 2.1*  PROT 7.5 7.5  ALBUMIN 3.3* 3.0*    Critical care time:      Steffanie Dunn, DO 05/01/23 5:51 PM [facility]  For contact information, see Amion. If no response to pager, please call PCCM consult pager. After hours, 7PM- 7AM, please call Elink.

## 2023-05-01 NOTE — Progress Notes (Signed)
Progress Note  Patient Name: Erika Kelley Date of Encounter: 05/01/2023  Primary Cardiologist: None   Subjective   Feels better. Denies having any recent GI issues. Has been drinking heavily, a bottle of vodka daily and not eating.  Inpatient Medications    Scheduled Meds:  aspirin EC  81 mg Oral Daily   Chlorhexidine Gluconate Cloth  6 each Topical Daily   enoxaparin (LOVENOX) injection  40 mg Subcutaneous Q24H   feeding supplement  237 mL Oral BID BM   folic acid  1 mg Oral Daily   furosemide  40 mg Intravenous Once   ipratropium-albuterol  3 mL Nebulization Q4H   methylPREDNISolone (SOLU-MEDROL) injection  40 mg Intravenous Q24H   multivitamin with minerals  1 tablet Oral Daily   nadolol  20 mg Oral Daily   sodium chloride flush  10-40 mL Intracatheter Q12H   thiamine  100 mg Oral Daily   Or   thiamine  100 mg Intravenous Daily   Continuous Infusions:  cefTRIAXone (ROCEPHIN)  IV Stopped (04/30/23 1618)   doxycycline (VIBRAMYCIN) IV Stopped (05/01/23 0526)   lidocaine 1 mg/min (05/01/23 0600)   PRN Meds: acetaminophen, hydrALAZINE, LORazepam **OR** LORazepam, nitroGLYCERIN, ondansetron (ZOFRAN) IV, mouth rinse, sodium chloride flush   Vital Signs    Vitals:   05/01/23 0400 05/01/23 0415 05/01/23 0500 05/01/23 0600  BP: (!) 125/107  (!) 136/99 (!) 121/97  Pulse: 80  79 77  Resp: 13  16 15   Temp:  98 F (36.7 C)    TempSrc:  Axillary    SpO2: 99%  100% 98%  Weight:      Height:        Intake/Output Summary (Last 24 hours) at 05/01/2023 0639 Last data filed at 05/01/2023 0600 Gross per 24 hour  Intake 2244.12 ml  Output 1890 ml  Net 354.12 ml   Filed Weights   04/29/23 1400  Weight: 72.6 kg    Telemetry    Sinus rhythm and sinus tachycardia. PVCs present on admission have largely resolved - Personally Reviewed  ECG    Sinus rhythm with prolonged QT - Personally Reviewed  Physical Exam   GEN: No acute distress but appears anxious.    Neck: No JVD Cardiac: RRR, no murmurs, rubs, or gallops.  Respiratory: Clear to auscultation bilaterally. GI: Soft, nontender, non-distended  MS: No edema; No deformity. Neuro:  Nonfocal  Psych: Normal affect   Labs    Chemistry Recent Labs  Lab 04/29/23 1335 04/29/23 1341 04/29/23 1922 04/30/23 0249 04/30/23 1536  NA 135 139  --  135 137  K 2.7* 3.3*  --  3.3* 4.1  CL 97* 96*  --  99 96*  CO2 25  --   --  25 30  GLUCOSE 156* 167*  --  121* 106*  BUN <5* <3*  --  <5* <5*  CREATININE 0.49 0.40* 0.49 0.55 0.47  CALCIUM 7.7*  --   --  8.3* 8.7*  PROT 7.5  --   --  7.5  --   ALBUMIN 3.3*  --   --  3.0*  --   AST 190*  --   --  140*  --   ALT 52*  --   --  48*  --   ALKPHOS 159*  --   --  146*  --   BILITOT 1.6*  --   --  2.1*  --   GFRNONAA >60  --  >60 >60 >60  ANIONGAP 13  --   --  11 11     Hematology Recent Labs  Lab 04/29/23 1922 04/30/23 0249 05/01/23 0605  WBC 3.7* 4.6 3.8*  RBC 4.08 4.33 4.08  HGB 11.5* 12.2 11.7*  HCT 34.2* 36.4 35.4*  MCV 83.8 84.1 86.8  MCH 28.2 28.2 28.7  MCHC 33.6 33.5 33.1  RDW 13.9 13.8 13.7  PLT 96* 110* 86*    Cardiac EnzymesNo results for input(s): "TROPONINI" in the last 168 hours. No results for input(s): "TROPIPOC" in the last 168 hours.   BNP Recent Labs  Lab 04/29/23 1922  BNP 488.8*     DDimer No results for input(s): "DDIMER" in the last 168 hours.   Summary of Pertinent studies    TTE: LVEF 35-40%, Gr II diastolic dysfunction  Imaging: CXR reviewed -- mild perihilar edema  Labs: reviewed  Patient Profile     52 y.o. female With recurrent ECGs demonstrating prolonged QT. During prior visits potassium has consistently been low. She is currently admitted after TdP in the setting of severe Mg and K depletion due to malnutrition and alcohol abuse.  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; possible acute trauma from CPR and shocks  Moderate MR noted Would initiate GDMT as she begins to recover from withdrawal  Alcohol abuse Now in alcohol withdrawal Continue CIWA  Respiratory failure Multifactorial -- Pulmonary edema from CHF, possible aspiration or CAP, COPD Critical care now primary and much appreciated on  Thrombocytopenia, transaminitis Suspect sequelae of alcohol abuse Hepatitis panel negative in 2023 Liver US 09/2021 showed increased echogenicity     For questions or updates, please contact [facility] Please consult [email or web address] for contact info under Cardiology/STEMI.      Signed, Maurice Small, MD 05/01/2023, 6:39 AM

## 2023-05-02 ENCOUNTER — Inpatient Hospital Stay (HOSPITAL_COMMUNITY): Payer: Commercial Managed Care - HMO

## 2023-05-02 ENCOUNTER — Telehealth (HOSPITAL_COMMUNITY): Payer: Self-pay | Admitting: Pharmacy Technician

## 2023-05-02 ENCOUNTER — Encounter (HOSPITAL_COMMUNITY): Payer: Self-pay | Admitting: Internal Medicine

## 2023-05-02 ENCOUNTER — Other Ambulatory Visit (HOSPITAL_COMMUNITY): Payer: Self-pay

## 2023-05-02 DIAGNOSIS — E876 Hypokalemia: Secondary | ICD-10-CM | POA: Diagnosis not present

## 2023-05-02 DIAGNOSIS — F1010 Alcohol abuse, uncomplicated: Secondary | ICD-10-CM

## 2023-05-02 DIAGNOSIS — J9601 Acute respiratory failure with hypoxia: Secondary | ICD-10-CM | POA: Diagnosis not present

## 2023-05-02 DIAGNOSIS — I700 Atherosclerosis of aorta: Secondary | ICD-10-CM

## 2023-05-02 DIAGNOSIS — I420 Dilated cardiomyopathy: Secondary | ICD-10-CM

## 2023-05-02 DIAGNOSIS — I472 Ventricular tachycardia, unspecified: Secondary | ICD-10-CM | POA: Diagnosis not present

## 2023-05-02 LAB — COOXEMETRY PANEL
Carboxyhemoglobin: 3.1 % — ABNORMAL HIGH (ref 0.5–1.5)
Methemoglobin: <0.7 % (ref 0.0–1.5)
O2 Saturation: 67 %
Total hemoglobin: 11 g/dL — ABNORMAL LOW (ref 12.0–16.0)

## 2023-05-02 LAB — BASIC METABOLIC PANEL
Anion gap: 4 — ABNORMAL LOW (ref 5–15)
BUN: 5 mg/dL — ABNORMAL LOW (ref 6–20)
CO2: 28 mmol/L (ref 22–32)
Calcium: 8.1 mg/dL — ABNORMAL LOW (ref 8.9–10.3)
Chloride: 102 mmol/L (ref 98–111)
Creatinine, Ser: 0.41 mg/dL — ABNORMAL LOW (ref 0.44–1.00)
GFR, Estimated: >60 mL/min (ref >60)
Glucose, Bld: 93 mg/dL (ref 70–99)
Potassium: 4 mmol/L (ref 3.5–5.1)
Sodium: 134 mmol/L — ABNORMAL LOW (ref 135–145)

## 2023-05-02 LAB — HEPATIC FUNCTION PANEL
ALT: 39 U/L (ref 0–44)
AST: 89 U/L — ABNORMAL HIGH (ref 15–41)
Albumin: 2.5 g/dL — ABNORMAL LOW (ref 3.5–5.0)
Alkaline Phosphatase: 145 U/L — ABNORMAL HIGH (ref 38–126)
Bilirubin, Direct: 0.7 mg/dL — ABNORMAL HIGH (ref 0.0–0.2)
Indirect Bilirubin: 0.8 mg/dL (ref 0.3–0.9)
Total Bilirubin: 1.5 mg/dL — ABNORMAL HIGH (ref <1.2)
Total Protein: 6.6 g/dL (ref 6.5–8.1)

## 2023-05-02 LAB — PROCALCITONIN: Procalcitonin: 0.16 ng/mL

## 2023-05-02 LAB — PHOSPHORUS: Phosphorus: 3.4 mg/dL (ref 2.5–4.6)

## 2023-05-02 LAB — MAGNESIUM: Magnesium: 1.8 mg/dL (ref 1.7–2.4)

## 2023-05-02 MED ORDER — PREDNISONE 20 MG PO TABS
40.0000 mg | ORAL_TABLET | Freq: Every day | ORAL | Status: DC
  Administered 2023-05-03: 40 mg via ORAL
  Filled 2023-05-02: qty 2

## 2023-05-02 MED ORDER — SACUBITRIL-VALSARTAN 24-26 MG PO TABS
1.0000 | ORAL_TABLET | Freq: Two times a day (BID) | ORAL | Status: DC
  Administered 2023-05-03: 1 via ORAL
  Filled 2023-05-02: qty 1

## 2023-05-02 MED ORDER — MAGNESIUM OXIDE -MG SUPPLEMENT 400 (240 MG) MG PO TABS: 400.0000 mg | ORAL_TABLET | Freq: Every day | ORAL | Status: DC

## 2023-05-02 MED ORDER — METOPROLOL TARTRATE 50 MG PO TABS
100.0000 mg | ORAL_TABLET | Freq: Once | ORAL | Status: AC
  Administered 2023-05-02: 100 mg via ORAL
  Filled 2023-05-02: qty 2

## 2023-05-02 MED ORDER — IOHEXOL 350 MG/ML SOLN
95.0000 mL | Freq: Once | INTRAVENOUS | Status: AC | PRN
  Administered 2023-05-02: 95 mL via INTRAVENOUS

## 2023-05-02 MED ORDER — LOSARTAN POTASSIUM 25 MG PO TABS: 12.5000 mg | ORAL_TABLET | Freq: Every day | ORAL | Status: DC

## 2023-05-02 MED ORDER — BUDESONIDE 0.25 MG/2ML IN SUSP
0.2500 mg | Freq: Two times a day (BID) | RESPIRATORY_TRACT | Status: DC
  Administered 2023-05-02 – 2023-05-03 (×2): 0.25 mg via RESPIRATORY_TRACT
  Filled 2023-05-02 (×2): qty 2

## 2023-05-02 MED ORDER — NITROGLYCERIN 0.4 MG SL SUBL
SUBLINGUAL_TABLET | SUBLINGUAL | Status: AC
  Filled 2023-05-02: qty 2

## 2023-05-02 MED ORDER — MAGNESIUM SULFATE 2 GM/50ML IV SOLN
2.0000 g | Freq: Once | INTRAVENOUS | Status: AC
Start: 2023-05-02 — End: 2023-05-02
  Administered 2023-05-02: 2 g via INTRAVENOUS
  Filled 2023-05-02: qty 50

## 2023-05-02 MED ORDER — REVEFENACIN 175 MCG/3ML IN SOLN
175.0000 ug | Freq: Every day | RESPIRATORY_TRACT | Status: DC
  Administered 2023-05-02 – 2023-05-03 (×2): 175 ug via RESPIRATORY_TRACT
  Filled 2023-05-02 (×2): qty 3

## 2023-05-02 MED ORDER — FUROSEMIDE 40 MG PO TABS
40.0000 mg | ORAL_TABLET | Freq: Every day | ORAL | Status: DC
  Administered 2023-05-02 – 2023-05-03 (×2): 40 mg via ORAL
  Filled 2023-05-02 (×2): qty 1

## 2023-05-02 MED ORDER — SACUBITRIL-VALSARTAN 24-26 MG PO TABS
1.0000 | ORAL_TABLET | Freq: Two times a day (BID) | ORAL | Status: DC
  Filled 2023-05-02: qty 1

## 2023-05-02 NOTE — Progress Notes (Signed)
Pt admitted to rm 1 from 2h. Initiated tele. VSS. Call bell within reach.   Lawson Radar, RN

## 2023-05-02 NOTE — Progress Notes (Signed)
[facility] ADULT ICU REPLACEMENT PROTOCOL   The patient does apply for the [facility] Adult ICU Electrolyte Replacment Protocol based on the criteria listed below:   1.Exclusion criteria: TCTS, ECMO, Dialysis, and Myasthenia Gravis patients 2. Is GFR >/= 30 ml/min? Yes.    Patient's GFR today is >60 3. Is SCr </= 2? Yes.   Patient's SCr is 0.41 mg/dL 4. Did SCr increase >/= 0.5 in 24 hours? No. 5.Pt's weight >40kg  Yes.   6. Abnormal electrolyte(s): Mag 1.8  7. Electrolytes replaced per protocol 8.  Call MD STAT for K+ </= 2.5, Phos </= 1, or Mag </= 1 Physician:  Lance Morin 05/02/2023 5:50 AM

## 2023-05-02 NOTE — Progress Notes (Signed)
Heart Failure Nurse Navigator Progress Note  PCP: [facility] PCP-Cardiologist: None Admission Diagnosis: Ventricular Tachycardia Admitted from: home - [place] to [place] via carelink  Presentation:   Erika Kelley presented with lightheadedness, diaphoretic, vomiting, 153/105, HR 82, BNP 488, Troponin 119, upon arrival to ER, patient went into [place], underwent CPR for a few seconds, spontaneously converted to NSR, then minutes later had a recurrent Vtach episode. EKG ,showed an underlying rhythm of: T prolongation with peaked T waves, subsequently noted to be in V. tach, did have some polymorphic V. Tach, and sinus rhythm  Per daughter patient went unresponsive in car on the way to hospital and she had to shake her , reported this happened last year as well and she was admitted into the hospital in [facility]. Patient was started on a Lidocaine drip and admitted to ICU.   Patient was educated on the sign and symptoms of heart failure, daily weights, when to call her doctor or go to  the ED, . Diet/ fluid restrictions , reported to drinking soda, like Pepsi and Mt. Dew, uses salt on food, and drinks Vodka weekly. Patient stated she is quitting her Alcohol consumption with this visit. Education on taking all medications as prescribed and attending all medical appointments. Patient voiced concern about her medication costs, she is currently not working, she lives with her father and recently lost a son, stated he passed away in his sleep). Patient verbalized her understanding of the education, is interested in quitting both smoking and her alcohol use. A HF TOC appointment was scheduled for 05/16/2023 @ 9 am.        ECHO/ LVEF: 35-40%  Clinical Course:  Past Medical History:  Diagnosis Date   Alcohol abuse    Hypertension    Ventricular tachycardia (HCC)      Social History   Socioeconomic History   Marital status: Single    Spouse name: Not on file   Number  of children: Not on file   Years of education: Not on file   Highest education level: Not on file  Occupational History   Not on file  Tobacco Use   Smoking status: Every Day    Current packs/day: 0.50    Types: Cigarettes   Smokeless tobacco: Never  Vaping Use   Vaping status: Never Used  Substance and Sexual Activity   Alcohol use: Yes    Comment: daily heavy use   Drug use: No   Sexual activity: Not on file  Other Topics Concern   Not on file  Social History Narrative   Not on file   Social Determinants of Health   Financial Resource Strain: Not on file  Food Insecurity: No Food Insecurity (04/29/2023)   Hunger Vital Sign    Worried About Running Out of Food in the Last Year: Never true    Ran Out of Food in the Last Year: Never true  Transportation Needs: No Transportation Needs (04/29/2023)   PRAPARE - Administrator, Civil Service (Medical): No    Lack of Transportation (Non-Medical): No  Physical Activity: Not on file  Stress: Not on file  Social Connections: Not on file   Education Assessment and Provision:  Detailed education and instructions provided on heart failure disease management including the following:  Signs and symptoms of Heart Failure When to call the physician Importance of daily weights Low sodium diet Fluid restriction Medication management Anticipated future follow-up appointments  Patient education given on  each of the above topics.  Patient acknowledges understanding via teach back method and acceptance of all instructions.  Education Materials:  "Living Better With Heart Failure" Booklet, HF zone tool, & Daily Weight Tracker Tool.  Patient has scale at home: yes Patient has pill box at home: No, will buy one   High Risk Criteria for Readmission and/or Poor Patient Outcomes: Heart failure hospital admissions (last 6 months): 0  No Show rate: 0 Difficult social situation: No, lives with her father Demonstrates  medication adherence: Yes Primary Language: English Literacy level: Reading, writing, and comprehension  Barriers of Care:   Diet/ fluid restrictions ( salt/ soda)  Daily weights Smoking / ETOH cessation  Considerations/Referrals:   Referral made to Heart Failure Pharmacist Stewardship: Yes, medication costs Referral made to Heart Failure CSW/NCM TOC: Yes, smoking cessation Referral made to Heart & Vascular TOC clinic: Yes, 05/16/2023 @ 9 am  Items for Follow-up on DC/TOC: Continued New HF education Diet/ fluid restrictions ( salt/ Soda)  Daily weights Smoking / ETOH cessation    Rhae Hammock, BSN, Scientist, clinical (histocompatibility and immunogenetics) Only

## 2023-05-02 NOTE — Progress Notes (Addendum)
Progress Note  Patient Name: Erika Kelley Date of Encounter: 05/02/2023  Primary Cardiologist: None   Subjective   Denies any chest pain or shortness of breath.  Lidocaine stopped yesterday and no further arrhythmias on telemetry.  Mag 1.8 today potassium 4  Inpatient Medications    Scheduled Meds:  aspirin EC  81 mg Oral Daily   Chlorhexidine Gluconate Cloth  6 each Topical Daily   enoxaparin (LOVENOX) injection  40 mg Subcutaneous Q24H   feeding supplement  237 mL Oral BID BM   folic acid  1 mg Oral Daily   furosemide  40 mg Intravenous Once   methylPREDNISolone (SOLU-MEDROL) injection  40 mg Intravenous Q24H   multivitamin with minerals  1 tablet Oral Daily   nadolol  20 mg Oral Daily   sodium chloride flush  10-40 mL Intracatheter Q12H   spironolactone  25 mg Oral Daily   thiamine  100 mg Oral Daily   Or   thiamine  100 mg Intravenous Daily   Continuous Infusions:  cefTRIAXone (ROCEPHIN)  IV Stopped (05/01/23 1453)   doxycycline (VIBRAMYCIN) IV Stopped (05/02/23 0634)   PRN Meds: acetaminophen, albuterol, hydrALAZINE, LORazepam **OR** LORazepam, nitroGLYCERIN, ondansetron (ZOFRAN) IV, mouth rinse, sodium chloride flush   Vital Signs    Vitals:   05/02/23 0600 05/02/23 0700 05/02/23 0800 05/02/23 0834  BP: (!) 120/97 (!) 140/97 (!) 119/95   Pulse: 73 92 77   Resp: 11 15 14    Temp:    98.5 F (36.9 C)  TempSrc:    Oral  SpO2: 99% 96% 91%   Weight:      Height:        Intake/Output Summary (Last 24 hours) at 05/02/2023 0837 Last data filed at 05/02/2023 0800 Gross per 24 hour  Intake 1745.72 ml  Output 2125 ml  Net -379.28 ml   Filed Weights   04/29/23 1400  Weight: 72.6 kg    Telemetry    Normal sinus rhythm- Personally Reviewed  ECG    EKG 11/24 showed normal sinus rhythm with prolonged QT at 546 ms which is essentially unchanged from EKG 11/23- Personally Reviewed  Physical Exam   GEN: Well nourished, well developed in no acute  distress HEENT: Normal NECK: No JVD; No carotid bruits LYMPHATICS: No lymphadenopathy CARDIAC:RRR, no murmurs, rubs, gallops RESPIRATORY:  Clear to auscultation without rales, wheezing or rhonchi  ABDOMEN: Soft, non-tender, non-distended MUSCULOSKELETAL:  No edema; No deformity  SKIN: Warm and dry NEUROLOGIC:  Alert and oriented x 3 PSYCHIATRIC:  Normal affect  Labs    Chemistry Recent Labs  Lab 04/29/23 1335 04/29/23 1341 04/30/23 0249 04/30/23 1536 05/01/23 0605 05/02/23 0411  NA 135   < > 135 137 129* 134*  K 2.7*   < > 3.3* 4.1 3.5 4.0  CL 97*   < > 99 96* 94* 102  CO2 25  --  25 30 29 28   GLUCOSE 156*   < > 121* 106* 148* 93  BUN <5*   < > <5* <5* <5* 5*  CREATININE 0.49   < > 0.55 0.47 0.37* 0.41*  CALCIUM 7.7*  --  8.3* 8.7* 8.1* 8.1*  PROT 7.5  --  7.5  --   --  6.6  ALBUMIN 3.3*  --  3.0*  --   --  2.5*  AST 190*  --  140*  --   --  89*  ALT 52*  --  48*  --   --  39  ALKPHOS 159*  --  146*  --   --  145*  BILITOT 1.6*  --  2.1*  --   --  1.5*  GFRNONAA >60   < > >60 >60 >60 >60  ANIONGAP 13  --  11 11 6  4*   < > = values in this interval not displayed.     Hematology Recent Labs  Lab 04/29/23 1922 04/30/23 0249 05/01/23 0605  WBC 3.7* 4.6 3.8*  RBC 4.08 4.33 4.08  HGB 11.5* 12.2 11.7*  HCT 34.2* 36.4 35.4*  MCV 83.8 84.1 86.8  MCH 28.2 28.2 28.7  MCHC 33.6 33.5 33.1  RDW 13.9 13.8 13.7  PLT 96* 110* 86*    Cardiac EnzymesNo results for input(s): "TROPONINI" in the last 168 hours. No results for input(s): "TROPIPOC" in the last 168 hours.   BNP Recent Labs  Lab 04/29/23 1922  BNP 488.8*     DDimer No results for input(s): "DDIMER" in the last 168 hours.   Summary of Pertinent studies    TTE: LVEF 35-40%, Gr II diastolic dysfunction  Imaging: CXR reviewed -- mild perihilar edema  Labs: reviewed  Patient Profile     52 y.o. female With recurrent ECGs demonstrating prolonged QT. During prior visits potassium has consistently been  low. She is currently admitted after TdP in the setting of severe Mg and K depletion due to malnutrition and alcohol abuse.  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 Review of her K+ levels shows that they are consistently very low Patient's son died in his sleep, which is not typical of LQT 1 but more associated with Brugada and LQT 3 Now off lidocaine with no further arrhythmias on telemetry Continue nadolol 20 mg daily Other recs per EP  Hypokalemia, Hypomagnesemia Due to malnutrition, chlorthalidone, alcohol abuse Chlorthalidone was stopped Potassium 4 and magnesium 1.8 today Getting IV magnesium today Currently not on potassium supplementation but is on spironolactone  Acute CHFrEF EF 35-40% 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; possible acute trauma from CPR and shocks  Moderate MR noted Is not appear volume overloaded on exam Would initiate GDMT as she begins to recover from withdrawal>> start Entresto 24-26 mg twice daily Currently on beta-blocker with nadolol Continue spironolactone 25 mg daily and beta-blocker She does have CRFs for CAD so will get coronary CTA to define coronary anatomy today  Plan to maximize GDMT outpatient and repeat 2D echo in 6 to 8 weeks  Alcohol abuse Now in alcohol withdrawal Continue CIWA  Respiratory failure Multifactorial -- Pulmonary edema from CHF, possible aspiration or CAP, COPD Critical care now primary and much appreciated on  Thrombocytopenia, transaminitis Suspect sequelae of alcohol abuse Hepatitis panel negative in 2023 Liver US 09/2021 showed increased echogenicity   Performed by: Armanda Magic, MD   Total critical care time: 40 minutes   Critical care time was exclusive of separately billable procedures and treating other patients.   Critical care was necessary to treat or prevent imminent or  life-threatening deterioration.   Critical care was time spent personally by me (independent of APPs or residents) on the following activities: development of treatment plan with patient and/or surrogate as well as nursing, discussions with consultants, evaluation of patient's response to treatment, examination of patient, obtaining history from patient or surrogate, ordering and performing treatments and interventions, ordering and review of laboratory studies, ordering and review  of radiographic studies, pulse oximetry and re-evaluation of patient's condition.  For questions or updates, please contact [facility] Please consult [email or web address] for contact info under Cardiology/STEMI.      Signed, Armanda Magic, MD 05/02/2023, 8:37 AM

## 2023-05-02 NOTE — Progress Notes (Signed)
NAME:  Erika Kelley, MRN:  [id number], DOB:  [birth year], LOS: 3 ADMISSION DATE:  04/29/2023, CONSULTATION DATE:  11/23 REFERRING MD:  Mealor- Cardiology, CHIEF COMPLAINT:  respiratory failure  History of Present Illness:  52 year old female with past medical history of daily bottle of vodka alcohol use, tobacco use disorder, hypertension, previous VT cardiac arrest who presented to [facility] with dizziness, vomiting diaphoresis. Family was available in ED and noted that prior to arrival, patient's eyes rolled back and she became unresponsive. The family member reportedly gave her a "chest thump" and she regained consciousness. In ED, developed episodes of VT requiring cardioversion and placed on lidocaine gtt per cardiology. She was transferred to [facility] for cardiology evaluation. On cardiology evaluation, noted to have torsades de pointes, cardiac arrest, prolonged QT. They transitioned her off lidocaine and onto nadolol.   Erika Kelley is a 52 y/o woman with a history of drinking a bottle of vodka per day, tobacco abuse, previous VT cardiac arrest who presented to the hospital with dizziness, vomiting, diaphoresis.  And transferred to the hospital she lost consciousness where her eyes rolled back in her head and she was able to give her a chest thump bringing her back to consciousness.  She has had a similar previous episode where she was admitted to the hospital in [place].  In the emergency room at [facility] she developed episodes of ventricular tachycardia, requiring cardioversion.  She was transferred to [facility] for cardiology evaluation.  Today she has developed worsened respiratory failure and agitation.  She drinks alcohol daily, especially over the last year since her son had a sudden cardiac arrest and died unexpectedly.  She smokes tobacco.  Pertinent  Medical History  ETOH abuse Tobacco abuse HTN VT w/ cardiac arrest, a/w hypokalemia  Significant Hospital Events: Including  procedures, antibiotic start and stop dates in addition to other pertinent events   11/22 presented to [facility] 11/23: worsening respiratory failure and agitation thought to be acute pulmonary edema and obstructive lung disease. Placed on BiPAP, diuresed, initiated steroids/bronchodilators, empiric CAP Coverage 11/24: improved resp function, BiPAP PRN, supplemental O2. 11/25: still intermittently hypoxic on room air. Nursing reports unsteady getting OOB.   Interim History / Subjective:  No complaints this morning, she is anxious to be discharged. Denies chest pain, sob, dizziness. Nursing reports NAEON. They do note when she gets up she is somewhat off balance. Weaning O2 but remains slightly hypoxic on room air. Will transfer out to [facility] today. Will need further PT, O2 weaning. Coronary CTA today per cardiology.   Objective   Blood pressure (!) 140/97, pulse 92, temperature 98.3 F (36.8 C), temperature source Oral, resp. rate 15, height 5\' 6"  (1.676 m), weight 72.6 kg, last menstrual period 05/25/2015, SpO2 96%.    Vent Mode: PSV;BIPAP FiO2 (%):  [40 %] 40 % PEEP:  [5 cmH20] 5 cmH20 Pressure Support:  [5 cmH20] 5 cmH20   Intake/Output Summary (Last 24 hours) at 05/02/2023 0720 Last data filed at 05/02/2023 0700 Gross per 24 hour  Intake 1873.15 ml  Output 2125 ml  Net -251.85 ml   Filed Weights   04/29/23 1400  Weight: 72.6 kg    Examination: General: chronically ill appearing woman sitting up in bed in NAD HENT: [place]/AT, eyes anicteric Neck: JVD Lungs: breathing comfortably on Delta, let anterior rhales, now wheezing today Cardiovascular: S1S2, RRR Abdomen: abd soft, NT Extremities: no cyanosis or edema Neuro: awake, alert, answering questions appropriately Derm: warm, dry, no rashes  Resolved Hospital Problem list   Hypomagnesemia, hypophosphatemia, agitation   Assessment & Plan:  Acute respiratory failure with hypoxia; likely multifactorial but thought to be acute  pulmonary edema overlying chronic obstructive lung disease. History of tobacco use. Cannot rule out pneumonia  - con't CAP coverage with doxycycline, ceftriaxone (avoiding azithromycin due to QTc) - transition to PO prednisone taper  - albuterol PRN  - + pulmicort, yupelri. Not adding brovana due to QTc) - recommend dc on LAMA/LABA and tobacco cessation   Ventricular Tachycardia; likely electrolyte related. History of VT due to elyte issues causing cardiac arrest.  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 Needs to initiate GDMT this admit - con't PICC for CVP, coox monitoring  - con't tele monitoring  - defer to cards if ischemia eval is warranted prior to discharge  Hypervolemic hyponatremia -monitor  Alcohol use disorder; no sign of withdrawal 11/25 - CIWA still in place if needed  - thiamine, folic acid, mtvn   Severe protein energy malnutrition; likely related to chronic alcohol use - encourage PO intake, Ensure  Tobacco abuse - smoking cessation recommended - Recommend outpatient referral for lung cancer screening  Chronic thrombocytopenia, likely due to alcohol abuse - No indication for transfusion, monitor  Elevated transaminases and hyperbilirubinemia; likely due to acute decompensated heart failure, although could have cirrhosis - RUQ [place] 11/24 w/ mild hepatomegaly, moderate steatosis   Best Practice (right click and "Reselect all SmartList Selections" daily)    Diet/type: Regular consistency (see orders) DVT prophylaxis: enoxaparin (LOVENOX) injection 40 mg Start: 04/30/23 1000  Pressure ulcer(s): not present on admission  GI prophylaxis: N/A Lines: Central line and yes and it is still needed- picc Foley:  N/A Code Status:  full code Last date of multidisciplinary goals of care discussion [11/25: updated patient on plan of care during rounds]  Labs   CBC: Recent Labs  Lab 04/29/23 1335 04/29/23 1341 04/29/23 1922 04/30/23 0249 05/01/23 0605  WBC 5.5  --  3.7* 4.6 3.8*  NEUTROABS 3.8  --   --   --   --   HGB 13.2 14.6 11.5* 12.2 11.7*  HCT 40.3 43.0 34.2* 36.4 35.4*  MCV 87.8  --  83.8 84.1 86.8  PLT 125*  --  96* 110* 86*    Basic Metabolic Panel: Recent Labs  Lab 04/29/23 1335 04/29/23 1341 04/29/23 1402 04/29/23 1922 04/30/23 0249 04/30/23 1536 05/01/23 0605 05/02/23 0411  NA 135 139  --   --  135 137 129* 134*  K 2.7* 3.3*  --   --  3.3* 4.1 3.5 4.0  CL 97* 96*  --   --  99 96* 94* 102  CO2 25  --   --   --  25 30 29 28   GLUCOSE 156* 167*  --   --  121* 106* 148* 93  BUN <5* <3*  --   --  <5* <5* <5* 5*  CREATININE 0.49 0.40*  --  0.49 0.55 0.47 0.37* 0.41*  CALCIUM 7.7*  --   --   --  8.3* 8.7* 8.1* 8.1*  MG  --   --  1.3*  --  1.6* 2.4 1.5* 1.8  PHOS  --   --   --   --   --  4.2 2.3* 3.4   GFR: Estimated Creatinine Clearance: 85.8 mL/min (A) (by C-G formula based on SCr of 0.41 mg/dL (L)). Recent Labs  Lab 04/29/23 1335 04/29/23 1922 04/30/23 0249 05/01/23 0605  WBC 5.5 3.7* 4.6 3.8*    Liver Function Tests: Recent Labs  Lab 04/29/23 1335 04/30/23 0249 05/02/23 0411  AST 190* 140* 89*  ALT 52* 48* 39  ALKPHOS 159* 146* 145*  BILITOT 1.6* 2.1* 1.5*  PROT 7.5 7.5 6.6  ALBUMIN 3.3* 3.0* 2.5*    Critical care time: 40    Lenard Galloway [facility] 05/02/23 7:21 AM  Please see [email or web address] for pager details.  From 7A-7P if no response, please call  [phone] After hours, please call ELink [phone]

## 2023-05-02 NOTE — TOC Initial Note (Signed)
Transition of Care [facility]) - Initial/Assessment Note    Patient Details  Name: Erika Kelley MRN: [id number] Date of Birth: 1971-01-09  [facility] CM/SW Contact:    Elliot Cousin, RN Phone Number: 05/02/2023, 4:37 PM  Clinical Narrative:                 CM spoke to pt and states she lives in home with Father.  States she was independent pta.  She currently will schedule an appt with [facility] Dept for hospital follow. She drives to her appts. Dtr will provide transportation home.  Pt has the CHF booklet to review.   Expected Discharge Plan: Home/Self Care Barriers to Discharge: Continued Medical Work up   Patient Goals and CMS Choice Patient states their goals for this hospitalization and ongoing recovery are:: wants to return home          Expected Discharge Plan and Services   Discharge Planning Services: CM Consult   Living arrangements for the past 2 months: Apartment                                      Prior Living Arrangements/Services Living arrangements for the past 2 months: Apartment Lives with:: Parents Patient language and need for interpreter reviewed:: Yes Do you feel safe going back to the place where you live?: Yes      Need for Family Participation in Patient Care: No (Comment) Care giver support system in place?: No (comment)   Criminal Activity/Legal Involvement Pertinent to Current Situation/Hospitalization: No - Comment as needed  Activities of Daily Living   ADL Screening (condition at time of admission) Independently performs ADLs?: Yes (appropriate for developmental age) Is the patient deaf or have difficulty hearing?: No Does the patient have difficulty seeing, even when wearing glasses/contacts?: No Does the patient have difficulty concentrating, remembering, or making decisions?: No  Permission Sought/Granted Permission sought to share information with : Case Manager, PCP, Family  Supports Permission granted to share information with : Yes, Verbal Permission Granted  Share Information with NAME: Melchor Amour     Permission granted to share info w Relationship: daughter  Permission granted to share info w Contact Information: [phone]  Emotional Assessment Appearance:: Appears stated age Attitude/Demeanor/Rapport: Engaged Affect (typically observed): Accepting Orientation: : Oriented to Self, Oriented to Place, Oriented to  Time, Oriented to Situation   Psych Involvement: No (comment)  Admission diagnosis:  Ventricular tachycardia (HCC) [I47.20] VT (ventricular tachycardia) (HCC) [I47.20] Patient Active Problem List   Diagnosis Date Noted   COPD with acute exacerbation (HCC) 05/01/2023   Ventricular tachycardia (HCC) 05/01/2023   VT (ventricular tachycardia) (HCC) 04/29/2023   Torsades de pointes (HCC) 04/29/2023   Prolonged Q-T interval on ECG 04/29/2023   Primary hypertension 04/29/2023   Hypokalemia 04/29/2023   Hypomagnesemia 04/29/2023   Hypocalcemia 04/29/2023   Polymorphic ventricular tachycardia (HCC) 04/29/2023   Aspiration pneumonia (HCC)    Acute respiratory failure with hypoxia (HCC)    OD (overdose of drug) 09/09/2021   Hypothermia 09/09/2021   Puerperal sepsis with acute hypoxic respiratory failure without septic shock [facility])    PCP:  [facility], [facility] Pharmacy:   [place] [facility] [place], [place] - [address] [place] [address] Phone: [phone] Fax: [phone]  [facility] - [place], [place] - [address]   [place] [address] Phone: [phone] Fax: [phone]  [facility] [address] [place] [address] Phone: [phone] Fax: [phone]     Social Determinants of Health (SDOH) Social History: SDOH Screenings   Food Insecurity: No Food Insecurity (04/29/2023)  Housing: Low Risk   (05/02/2023)  Transportation Needs: No Transportation Needs (05/02/2023)  Utilities: Not At Risk (04/29/2023)  Alcohol Screen: High Risk (04/29/2023)  Financial Resource Strain: Medium Risk (05/02/2023)  Tobacco Use: High Risk (04/30/2023)   SDOH Interventions: Housing Interventions: Intervention Not Indicated Transportation Interventions: Intervention Not Indicated Financial Strain Interventions: Intervention Not Indicated   Readmission Risk Interventions     No data to display

## 2023-05-02 NOTE — Telephone Encounter (Signed)
Patient Product/process development scientist completed.    The patient is insured through [employer] and [facility] MEDICAID.     Ran test claim for Entresto 24-26 mg and the current 30 day co-pay is $4.00.   This test claim was processed through [facility]- copay amounts may vary at other pharmacies due to pharmacy/plan contracts, or as the patient moves through the different stages of their insurance plan.     Roland Earl, CPHT Pharmacy Technician III Certified Patient Advocate [facility] Patient Advocate Team Direct Number: [phone]  Fax: [phone]

## 2023-05-02 NOTE — Progress Notes (Signed)
   Heart Failure Stewardship Pharmacist Progress Note   PCP: [facility], [facility] Public PCP-Cardiologist: None    HPI:  52 yo F with PMH of HTN and possible seizures.   Presented to the ED on 11/22 with syncopal event. Upon arrival to the ED, she went into VT and underwent CPR for a few seconds. Spontaneously converted to NSR. Within a few minutes, she had recurrent VT and received 1 shock to convert to NSR. She was started on IV amiodarone. K was 2.7 and magnesium 1.3 initially. Received aggressive supplementation. Reported excessive alcohol intake and not eating regularly. IV amiodarone transitioned to lidocaine with prolonged QTc.   CXR with mild edema. ECHO 11/23 with LVEF 35-40%, global hypokinesis, G2DD, RV normal, moderate MR. 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   Current HF Medications: Diuretic: furosemide 40 mg PO daily ACE/ARB/ARNI: Entresto 24/26 mg BID MRA: spironolactone 25 mg daily  Prior to admission HF Medications: None  Pertinent Lab Values: Serum creatinine 0.41, BUN 5, Potassium 4.0, Sodium 134, BNP 488.8, Magnesium 1.8   Vital Signs: Weight: 160 lbs (admission weight: 160 lbs) Blood pressure: 120/90s  Heart rate: 60-70s  I/O: net +0.8L yesterday; net +0.5L since admission  Medication Assistance / Insurance Benefits Check: Does the patient have prescription insurance?  Yes Type of insurance plan: [place] Medicaid  Outpatient Pharmacy:  Prior to [facility] Is the patient willing to use [facility] TOC pharmacy at discharge? Yes Is the patient willing to transition their outpatient pharmacy to utilize a [facility] outpatient pharmacy?   Pending    Assessment: 1. Acute systolic CHF (LVEF 35-40%) due to NICM. NYHA class II symptoms. - Agree with starting furosemide 40 mg PO  daily. Strict I/Os and daily weights. Keep K>4 and Mg>2. Magnesium 2 g IV x 1 given for replacement.  - On nadolol 20 mg daily per EP for torsades. Now off lidocaine gtt.  - Agree with starting Entresto 24/26 mg BID (first dose 11/26) - Continue spironolactone 25 mg daily - Consider starting Farxiga 10 mg daily prior to discharge   Plan: 1) Medication changes recommended at this time: - Add Farxiga 10 mg daily tomorrow - Stop chlorthalidone on discharge  2) Patient assistance: - Has [place] Medicaid - Entresto copay $4 - Farxiga $4 - Jardiance not covered on insurance  3)  Education  - To be completed prior to discharge -  patient is not yet back from [facility]  Sharen Hones, PharmD, BCPS Heart Failure Engineer, building services Phone [phone]

## 2023-05-02 NOTE — Progress Notes (Signed)
Coronary CTA done today and reviewed and demonstrates coronary calcium score of 863 which places the patient in the 99th percentile for age, race and sex matched controls.  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  Medical management recommended.  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  Recommendations as follows: Aspirin 81 mg daily Lasix 40 mg daily Nadolol 20 mg daily Entresto 24-26 mg twice daily Spironolactone 25 mg daily Will hold off on statin therapy due to history of alcoholism  Arrange outpatient follow-up with [facility] in [place] with extender or Dr. Jenene Slicker in 7-10 days with follow-up bmet.

## 2023-05-03 ENCOUNTER — Other Ambulatory Visit (HOSPITAL_COMMUNITY): Payer: Self-pay

## 2023-05-03 ENCOUNTER — Telehealth (HOSPITAL_COMMUNITY): Payer: Self-pay

## 2023-05-03 DIAGNOSIS — I472 Ventricular tachycardia, unspecified: Secondary | ICD-10-CM | POA: Diagnosis not present

## 2023-05-03 MED ORDER — CEFDINIR 300 MG PO CAPS
300.0000 mg | ORAL_CAPSULE | Freq: Two times a day (BID) | ORAL | 0 refills | Status: AC
  Filled 2023-05-03: qty 4, 2d supply, fill #0

## 2023-05-03 MED ORDER — THIAMINE HCL 100 MG PO TABS
100.0000 mg | ORAL_TABLET | Freq: Every day | ORAL | 0 refills | Status: AC
  Filled 2023-05-03: qty 30, 30d supply, fill #0

## 2023-05-03 MED ORDER — FUROSEMIDE 40 MG PO TABS
40.0000 mg | ORAL_TABLET | Freq: Every day | ORAL | 0 refills | Status: DC
  Filled 2023-05-03: qty 30, 30d supply, fill #0

## 2023-05-03 MED ORDER — SPIRONOLACTONE 25 MG PO TABS
25.0000 mg | ORAL_TABLET | Freq: Every day | ORAL | 0 refills | Status: DC
  Filled 2023-05-03: qty 30, 30d supply, fill #0

## 2023-05-03 MED ORDER — NITROGLYCERIN 0.4 MG SL SUBL
0.4000 mg | SUBLINGUAL_TABLET | SUBLINGUAL | 0 refills | Status: AC | PRN
  Filled 2023-05-03: qty 25, 8d supply, fill #0

## 2023-05-03 MED ORDER — NADOLOL 20 MG PO TABS
20.0000 mg | ORAL_TABLET | Freq: Every day | ORAL | 0 refills | Status: DC
  Filled 2023-05-03: qty 30, 30d supply, fill #0

## 2023-05-03 MED ORDER — SACUBITRIL-VALSARTAN 24-26 MG PO TABS
1.0000 | ORAL_TABLET | Freq: Two times a day (BID) | ORAL | 0 refills | Status: DC
  Filled 2023-05-03: qty 60, 30d supply, fill #0

## 2023-05-03 MED ORDER — ASPIRIN 81 MG PO TBEC
81.0000 mg | DELAYED_RELEASE_TABLET | Freq: Every day | ORAL | 12 refills | Status: AC
  Filled 2023-05-03: qty 30, 30d supply, fill #0

## 2023-05-03 MED ORDER — PREDNISONE 20 MG PO TABS
40.0000 mg | ORAL_TABLET | Freq: Every day | ORAL | 0 refills | Status: AC
  Filled 2023-05-03: qty 8, 4d supply, fill #0

## 2023-05-03 NOTE — TOC Transition Note (Signed)
Transition of Care (TOC) - CM/SW Discharge Note Donn Pierini RN, BSN Transitions of Care Unit 4E- RN Case Manager See Treatment Team for direct phone #   Patient Details  Name: GERTRUDIS ROE MRN: [id number] Date of Birth: 01-28-1971  Transition of Care [facility] CM/SW Contact:  Darrold Span, RN Phone Number: 05/03/2023, 11:15 AM   Clinical Narrative:    Pt stable for transition home today, family to transport home.  No HH or DME needs noted per PT eval.   Pt to follow up as per AVS instructions. No TOC needs noted.    Final next level of care: Home/Self Care Barriers to Discharge: Barriers Resolved   Patient Goals and CMS Choice   Choice offered to / list presented to : NA  Discharge Placement             Home            Discharge Plan and Services Additional resources added to the After Visit Summary for     Discharge Planning Services: CM Consult Post Acute Care Choice: NA          DME Arranged: N/A DME Agency: NA       HH Arranged: NA HH Agency: NA        Social Determinants of Health (SDOH) Interventions SDOH Screenings   Food Insecurity: No Food Insecurity (04/29/2023)  Housing: Low Risk  (05/02/2023)  Transportation Needs: No Transportation Needs (05/02/2023)  Utilities: Not At Risk (04/29/2023)  Alcohol Screen: High Risk (04/29/2023)  Financial Resource Strain: Medium Risk (05/02/2023)  Tobacco Use: High Risk (04/30/2023)     Readmission Risk Interventions    05/03/2023   11:15 AM  Readmission Risk Prevention Plan  Post Dischage Appt Complete  Medication Screening Complete  Transportation Screening Complete

## 2023-05-03 NOTE — Progress Notes (Addendum)
D/c tele and IV. Went over AVS with pt and all questions were addressed.   Lawson Radar, RN

## 2023-05-03 NOTE — Evaluation (Signed)
Physical Therapy Brief Evaluation and Discharge Note Patient Details Name: Erika Kelley MRN: [id number] DOB: [birth year] Today's Date: 05/03/2023   History of Present Illness  52 yo female admitted 11/22 after becoming unresponsive in car with VT brief CPR, repeat VT and shock. Pt with severe electrolyte derangements, severe hypokalemia, severe hypomagnesemia and hypocalcemia, acute CHF. PMHx: polysubstance use, COPD, HTN  Clinical Impression  Pt pleasant and states she recalls picking up her daughter then not feeling well. Pt aware ETOH use lead to electrolyte derangements and states she has no desire to continue drinking and does not feel she needs assistance with substance abuse. Pt moving well and able to perform all tasks, gait and stairs independently. Pt at baseline, independent and no further therapy needs at this time. Will sign off.  HR 74-114       PT Assessment Patient does not need any further PT services  Assistance Needed at Discharge  None    Equipment Recommendations None recommended by PT  Recommendations for Other Services       Precautions/Restrictions Precautions Precautions: None        Mobility  Bed Mobility   Supine/Sidelying to sit: Independent      Transfers Overall transfer level: Independent                      Ambulation/Gait Ambulation/Gait assistance: Independent Gait Distance (Feet): 300 Feet Assistive device: None Gait Pattern/deviations: WFL(Within Functional Limits) Gait Speed: Pace WFL    Home Activity Instructions    Stairs Stairs: Yes Stairs assistance: Modified independent (Device/Increase time) Stair Management: One rail Left Number of Stairs: 5    Modified Rankin (Stroke Patients Only)        Balance Overall balance assessment: No apparent balance deficits (not formally assessed)                        Pertinent Vitals/Pain   Pain Assessment Pain Assessment: No/denies pain      Home Living Family/patient expects to be discharged to:: Private residence Living Arrangements: Parent Available Help at Discharge: Family;Available PRN/intermittently Home Environment: Level entry   Home Equipment: None        Prior Function Level of Independence: Independent      UE/LE Assessment   UE ROM/Strength/Tone/Coordination: WFL    LE ROM/Strength/Tone/Coordination: [facility]      Communication   Communication Communication: No apparent difficulties     Cognition Overall Cognitive Status: Appears within functional limits for tasks assessed/performed       General Comments      Exercises     Assessment/Plan    PT Problem List         PT Visit Diagnosis Other abnormalities of gait and mobility (R26.89)    No Skilled PT All education completed;Patient at baseline level of functioning   Co-evaluation                AMPAC 6 Clicks Help needed turning from your back to your side while in a flat bed without using bedrails?: None Help needed moving from lying on your back to sitting on the side of a flat bed without using bedrails?: None Help needed moving to and from a bed to a chair (including a wheelchair)?: None Help needed standing up from a chair using your arms (e.g., wheelchair or bedside chair)?: None Help needed to walk in hospital room?: None Help needed climbing 3-5 steps with a railing? : None 6  Click Score: 24      End of Session Equipment Utilized During Treatment: Gait belt Activity Tolerance: Patient tolerated treatment well Patient left: in chair Nurse Communication: Mobility status PT Visit Diagnosis: Other abnormalities of gait and mobility (R26.89)     Time: 1610-9604 PT Time Calculation (min) (ACUTE ONLY): 14 min  Charges:   PT Evaluation $PT Eval Low Complexity: 1 Low      [name] P, PT Acute Rehabilitation Services Office: [phone]   [name]  05/03/2023, 8:33 AM

## 2023-05-03 NOTE — Telephone Encounter (Signed)
Pharmacy Patient Advocate Encounter   Received notification that prior authorization for Nadolol 20MG  tabletsis required/requested.   Insurance verification completed.   The patient is insured through [facility] .   Per test claim: PA required; PA submitted to above mentioned insurance via CoverMyMeds Key/confirmation #/EOC [id number] Status is pending

## 2023-05-03 NOTE — Discharge Instructions (Signed)
Advised to follow-up with primary care physician in 1 week. Advised to follow-up with cardiology Dr. Francene Castle in 2 weeks. Advised to take Omnicef 300 mg twice daily for 3 days for community-acquired pneumonia.  Discharge medications: Aspirin 81 mg daily Lasix 40 mg daily Nadolol 20 mg daily Entresto 24-26 mg twice daily Spironolactone 25 mg daily Will hold off on statin therapy due to history of alcoholism

## 2023-05-03 NOTE — Progress Notes (Signed)
BiPAP last worn in [facility] 11/24 am. Will monitor. BiPAP available if required.

## 2023-05-03 NOTE — Progress Notes (Signed)
   Heart Failure Stewardship Pharmacist Progress Note   PCP: [facility] Public PCP-Cardiologist: None    HPI:  52 yo F with PMH of HTN and possible seizures.   Presented to the ED on 11/22 with syncopal event. Upon arrival to the ED, she went into VT and underwent CPR for a few seconds. Spontaneously converted to NSR. Within a few minutes, she had recurrent VT and received 1 shock to convert to NSR. She was started on IV amiodarone. K was 2.7 and magnesium 1.3 initially. Received aggressive supplementation. Reported excessive alcohol intake and not eating regularly. IV amiodarone transitioned to lidocaine with prolonged QTc.   CXR with mild edema. ECHO 11/23 with LVEF 35-40%, global hypokinesis, G2DD, RV normal, moderate MR. 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   Denies shortness of breath. No LE edema on exam.   Discharge HF Medications: Diuretic: furosemide 40 mg PO daily ACE/ARB/ARNI: Entresto 24/26 mg BID MRA: spironolactone 25 mg daily  Prior to admission HF Medications: None  Pertinent Lab Values: As of 11/25: Serum creatinine 0.41, BUN 5, Potassium 4.0, Sodium 134, BNP 488.8, Magnesium 1.8   Vital Signs: Weight: 160 lbs (admission weight: 160 lbs) Blood pressure: 110/70s  Heart rate: 60-70s  I/O: net -1.2L yesterday; net +0.2L since admission  Medication Assistance / Insurance Benefits Check: Does the patient have prescription insurance?  Yes Type of insurance plan: [place] Medicaid  Outpatient Pharmacy:  Prior to admission outpatient pharmacy: [facility] Is the patient willing to use [facility] TOC pharmacy at discharge? Yes Is the patient willing to transition their outpatient pharmacy to utilize a [facility] outpatient pharmacy?   No    Assessment: 1. Acute systolic CHF (LVEF 35-40%) due to NICM. NYHA  class II symptoms. - Continue furosemide 40 mg PO daily. Strict I/Os and daily weights. Keep K>4 and Mg>2. - On nadolol 20 mg daily per EP for torsades. Now off lidocaine gtt.  - Continue Entresto 24/26 mg BID (first dose this AM) - Continue spironolactone 25 mg daily - Consider starting Farxiga 10 mg daily at follow up if creatinine stable   Plan: 1) Medication changes recommended at this time: - Add Farxiga 10 mg daily at follow up - Stop chlorthalidone on discharge  2) Patient assistance: - Has [place] Medicaid - Entresto copay $4 - Farxiga $4 - Jardiance not covered on insurance  3)  Education  - Patient has been educated on current HF medications and potential additions to HF medication regimen - Patient verbalizes understanding that over the next few months, these medication doses may change and more medications may be added to optimize HF regimen - Patient has been educated on basic disease state pathophysiology and goals of therapy   Sharen Hones, PharmD, BCPS Heart Failure Engineer, building services Phone [phone]

## 2023-05-03 NOTE — Discharge Summary (Signed)
Physician Discharge Summary  Erika Kelley ZOX:[id number] DOB: 05-29-1971 DOA: 04/29/2023  PCP: Randell Patient [facility]  Admit date: 04/29/2023  Discharge date: 05/03/2023  Admitted From: Home  Disposition:  Home  Recommendations for Outpatient Follow-up:  Follow up with PCP in 1-2 weeks. Please obtain BMP/CBC in one week. Advised to follow-up with Cardiology Dr. Harvie Bridge in 2 weeks. Advised to take Omnicef 300 mg twice daily for 3 days for community-acquired pneumonia.  Home Health: None Equipment/Devices:None  Discharge Condition: Stable CODE STATUS:Full code Diet recommendation: Heart Healthy   Brief [facility] Course: This 52 year old female with PMH significant of daily bottle of vodka alcohol use, tobacco use disorder, hypertension, previous VT cardiac arrest who presented to [facility] ED with dizziness, vomiting,  diaphoresis. Family was available in ED and noted that prior to arrival, patient's eyes rolled back and she became unresponsive. The family member reportedly gave her a "chest thump" and she regained consciousness. In ED, developed episodes of VT requiring cardioversion and placed on lidocaine gtt per cardiology. She was transferred to [facility] for cardiology evaluation. On cardiology evaluation, noted to have torsades de pointes, cardiac arrest, prolonged QT. They transitioned her off lidocaine and onto nadolol.  Patient was admitted in the ICU, started on doxycycline and ceftriaxone for community-acquired pneumonia, She was also started on prednisone taper.  Cardiology has evaluated the patient and did medication adjustment.  Coronary CT scan shows mild coronary artery disease.  Medication adjusted.  Patient felt better and wants to be discharged.  Cardiology agreed with the discharge planning.  Patient being discharged home.   Discharge medications: Aspirin 81 mg daily Lasix 40 mg daily Nadolol 20 mg daily Entresto 24-26 mg twice daily Spironolactone 25  mg daily Will hold off on statin therapy due to history of alcoholism   Discharge Diagnoses:  Principal Problem:   VT (ventricular tachycardia) (HCC) Active Problems:   Torsades de pointes (HCC)   Prolonged Q-T interval on ECG   Primary hypertension   Hypokalemia   Hypomagnesemia   Hypocalcemia   Polymorphic ventricular tachycardia (HCC)   COPD with acute exacerbation (HCC)   Ventricular tachycardia [facility])    Discharge Instructions  Discharge Instructions     Ambulatory referral to Genetics   Complete by: As directed    Specific reason for medical genetics evaluation: Family hx SCD, VT arrest.   Call MD for:  difficulty breathing, headache or visual disturbances   Complete by: As directed    Call MD for:  persistant dizziness or light-headedness   Complete by: As directed    Call MD for:  persistant nausea and vomiting   Complete by: As directed    Diet - low sodium heart healthy   Complete by: As directed    Diet Carb Modified   Complete by: As directed    Discharge instructions   Complete by: As directed    Advised to follow-up with primary care physician in 1 week. Advised to follow-up with cardiology Dr. Francene Castle in 2 weeks. Advised to take Omnicef 300 mg twice daily for 3 days for community-acquired pneumonia.  Discharge medications: Aspirin 81 mg daily Lasix 40 mg daily Nadolol 20 mg daily Entresto 24-26 mg twice daily Spironolactone 25 mg daily Will hold off on statin therapy due to history of alcoholism   Increase activity slowly   Complete by: As directed       Allergies as of 05/03/2023   No Known Allergies      Medication List  STOP taking these medications    cloNIDine 0.2 MG tablet Commonly known as: CATAPRES       TAKE these medications    acetaminophen 500 MG tablet Commonly known as: TYLENOL Take 500 mg by mouth every 6 (six) hours as needed for mild pain (pain score 1-3).   aspirin EC 81 MG tablet Take 1 tablet (81 mg  total) by mouth daily. Swallow whole. Start taking on: May 04, 2023   cefdinir 300 MG capsule Commonly known as: OMNICEF Take 1 capsule (300 mg total) by mouth 2 (two) times daily for 2 days.   Entresto 24-26 MG Generic drug: sacubitril-valsartan Take 1 tablet by mouth 2 (two) times daily.   furosemide 40 MG tablet Commonly known as: LASIX Take 1 tablet (40 mg total) by mouth daily. Start taking on: May 04, 2023   ibuprofen 200 MG tablet Commonly known as: ADVIL Take 200 mg by mouth every 6 (six) hours as needed for mild pain (pain score 1-3).   nadolol 20 MG tablet Commonly known as: CORGARD Take 1 tablet (20 mg total) by mouth daily. Start taking on: May 04, 2023   nitroGLYCERIN 0.4 MG SL tablet Commonly known as: NITROSTAT Place 1 tablet (0.4 mg total) under the tongue every 5 (five) minutes x 3 doses as needed for chest pain.   predniSONE 20 MG tablet Commonly known as: DELTASONE Take 2 tablets (40 mg total) by mouth daily with breakfast for 4 days. Start taking on: May 04, 2023   spironolactone 25 MG tablet Commonly known as: ALDACTONE Take 1 tablet (25 mg total) by mouth daily. Start taking on: May 04, 2023   thiamine 100 MG tablet Commonly known as: VITAMIN B1 Take 1 tablet (100 mg total) by mouth daily. Start taking on: May 04, 2023        Follow-up Information     [facility]. Go in 12 day(s).   Specialty: Cardiology Why: Hospital follow up 05/16/2023 @ 9 am PLEASE bring a current medication list to appointment FREE valet parking , Entrance C, off [employer] information: [address] [place] [address] [phone]        Ellsworth Lennox, PA-C Follow up on 05/10/2023.   Specialties: Cardiology, Cardiology Why: 2:00PM. Cardiology follow up. Obtain BMET blood work at the same time Solicitor information: [address] [place]  [address] [phone]         [facility] Follow up.   Why: hospital follow up appt scheduled for 05/11/2023 at 1030 am, please call to if you please call to cancel bring copay, insurance card and arrive 15 minutes before appt Contact information: [address] [place] [address] [phone]                No Known Allergies  Consultations: Cardiology Pulmonology   Procedures/Studies: CT CORONARY MORPH W/CTA COR W/SCORE W/CA W/CM &/OR WO/CM  Addendum Date: 05/02/2023   ADDENDUM REPORT: 05/02/2023 19:35 EXAM: OVER-READ INTERPRETATION  CT CHEST The following report is an over-read performed by radiologist Dr. Noe Gens [facility] on 05/02/2023. This over-read does not include interpretation of cardiac or coronary anatomy or pathology. The coronary CTA interpretation by the cardiologist is attached. COMPARISON:  None. FINDINGS: Heart is normal size. Aorta normal caliber. No adenopathy. Dependent opacities in the lower lobes compatible with dependent atelectasis. Centrilobular and paraseptal emphysema. No acute findings in the upper abdomen. Chest wall  soft tissues are unremarkable. No acute bony abnormality. IMPRESSION: Mild emphysema. Dependent atelectasis in the lower lobes. Electronically Signed   By: Charlett Nose M.D.   On: 05/02/2023 19:35   Result Date: 05/02/2023 HISTORY: 52 yo female with chest pain, nonspecific, systolic heart failure EXAM: Cardiac/Coronary CTA TECHNIQUE: The patient was scanned on a [employer]. PROTOCOL: A 120 kV prospective scan was triggered in the descending thoracic aorta at 111 HU's. Axial non-contrast 3 mm slices were carried out through the heart. The data set was analyzed on a dedicated work station and scored using the Agatson method. Gantry rotation speed was 250 msecs and collimation was .6 mm. Beta blockade and 0.8 mg of sl NTG was given. The 3D data set was reconstructed in 5% intervals of the  35-75 % of the R-R cycle. Diastolic phases were analyzed on a dedicated work station using MPR, MIP and VRT modes. The patient received 95mL OMNIPAQUE IOHEXOL 350 MG/ML SOLN contrast. FINDINGS: Quality: Fair, HR 61, attenuation artifact, mis-registration Coronary calcium score: The patient's coronary artery calcium score is 863, which places the patient in the 99th percentile [facility]). Coronary arteries: Normal coronary origins.  Right dominance. Right Coronary Artery: Dominant. Heavy proximal calcification with mild (25-49%) stenosis. Normal R-PDA and R-PLB branches. Left Main Coronary Artery: Normal. Bifurcates into the LAD and LCX arteries. Left Anterior Descending Coronary Artery: Large anterior artery that wraps around the apex. Minimal mixed (1-24%) proximal stenosis. 3 small diagonal branches, no disease. Left Circumflex Artery: AV groove LCx artery with mild proximal mixed (25-49%) stenosis. Large OM1 branch with mild mixed (1-24%) ostial stenosis. OM2 branch without disease. Aorta: Normal size, 33 mm at the mid ascending aorta (level of the PA bifurcation) measured double oblique. Aortic atherosclerosis. No dissection. Aortic Valve: Trileaflet. No calcifications. Other findings: Normal pulmonary vein drainage into the left atrium. Normal left atrial appendage without a thrombus. Dilated main pulmonary artery to 29 mm, suggestive of pulmonary hypertension. IMPRESSION: 1. 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 2. Coronary artery calcium score is 863, which places the patient in the 99th percentile for age/race and sex-matched controls (MESA). 3. Normal coronary origin with right dominance. 4. Dilated main pulmonary artery to 29 mm, suggestive of pulmonary hypertension. 5. Aortic atherosclerosis. 6. Aggressive secondary risk factor modification is advised. Electronically Signed: By: Chrystie Nose M.D. On: 05/02/2023 15:46   US Abdomen Limited RUQ (LIVER/GB)  Result Date:  05/01/2023 CLINICAL DATA:  Hyperbilirubinemia EXAM: ULTRASOUND ABDOMEN LIMITED RIGHT UPPER QUADRANT COMPARISON:  None Available. FINDINGS: Gallbladder: No gallstones or wall thickening visualized. No sonographic Murphy sign noted by sonographer. Common bile duct: Diameter: 2 mm at the biliary hilum Liver: Mild hepatomegaly. Hepatic parenchymal echogenicity is diffusely increased and the hepatic echotexture is diffusely coarsened in keeping with moderate hepatic steatosis. No focal intrahepatic masses are seen and there is no intrahepatic biliary ductal dilation. Portal vein is patent on color Doppler imaging with normal direction of blood flow towards the liver. Other: None. IMPRESSION: 1. Mild hepatomegaly. Moderate hepatic steatosis. Electronically Signed   By: Helyn Numbers M.D.   On: 05/01/2023 01:06   DG CHEST PORT 1 VIEW  Result Date: 04/30/2023 CLINICAL DATA:  [id number] Respiratory failure (HCC) [id number] EXAM: PORTABLE CHEST 1 VIEW COMPARISON:  April 29, 2023 FINDINGS: The cardiomediastinal silhouette is unchanged in the large in contour.Favored underlying emphysematous changes. Similar diffuse bronchial wall prominence with mild interstitial prominence. Band like opacity of the RIGHT mid lung. No pleural effusion. No  pneumothorax. IMPRESSION: 1. Similar diffuse bronchial wall prominence with mild interstitial prominence. This could reflect bronchitis versus pulmonary edema. 2. Band like opacity of the RIGHT mid lung, favored to reflect atelectasis. Electronically Signed   By: Meda Klinefelter M.D.   On: 04/30/2023 17:50   [place] EKG SITE RITE  Result Date: 04/30/2023 If Site Rite image not attached, placement could not be confirmed due to current cardiac rhythm.  ECHOCARDIOGRAM COMPLETE  Result Date: 04/30/2023    ECHOCARDIOGRAM REPORT   Patient Name:   ANNDREA STOKLEY Date of Exam: 04/30/2023 Medical Rec #:  [id number]          Height:       66.0 in Accession #:    [id number]         Weight:        160.1 lb Date of Birth:  1971-01-24          BSA:          1.819 m Patient Age:    52 years           BP:           156/109 mmHg Patient Gender: F                  HR:           96 bpm. Exam Location:  Inpatient Procedure: 2D Echo, Color Doppler and Cardiac Doppler Indications:    Cardiac Arrest  History:        Patient has prior history of Echocardiogram examinations, most                 recent 09/09/2021. Risk Factors:Hypertension and Current Smoker.  Sonographer:    Raeford Razor RDCS Referring Phys: [id number] Ellsworth Lennox  Sonographer Comments: Image acquisition challenging due to respiratory motion. IMPRESSIONS  1. Left ventricular ejection fraction, by estimation, is 35 to 40%. The left ventricle has moderately decreased function. The left ventricle demonstrates global hypokinesis. Left ventricular diastolic parameters are consistent with Grade II diastolic dysfunction (pseudonormalization).  2. Right ventricular systolic function is normal. The right ventricular size is normal.  3. Left atrial size was mildly dilated.  4. The mitral valve is normal in structure. Moderate mitral valve regurgitation. No evidence of mitral stenosis.  5. The aortic valve is tricuspid. There is mild calcification of the aortic valve. Aortic valve regurgitation is not visualized. No aortic stenosis is present.  6. The inferior vena cava is dilated in size with <50% respiratory variability, suggesting right atrial pressure of 15 mmHg. FINDINGS  Left Ventricle: Left ventricular ejection fraction, by estimation, is 35 to 40%. The left ventricle has moderately decreased function. The left ventricle demonstrates global hypokinesis. The left ventricular internal cavity size was normal in size. There is borderline asymmetric left ventricular hypertrophy of the basal-septal segment. Left ventricular diastolic parameters are consistent with Grade II diastolic dysfunction (pseudonormalization). Right Ventricle: The right ventricular size  is normal. No increase in right ventricular wall thickness. Right ventricular systolic function is normal. Left Atrium: Left atrial size was mildly dilated. Right Atrium: Right atrial size was normal in size. Pericardium: There is no evidence of pericardial effusion. Mitral Valve: The mitral valve is normal in structure. Moderate mitral valve regurgitation. No evidence of mitral valve stenosis. Tricuspid Valve: The tricuspid valve is normal in structure. Tricuspid valve regurgitation is trivial. No evidence of tricuspid stenosis. Aortic Valve: The aortic valve is tricuspid. There is mild calcification of the aortic valve.  Aortic valve regurgitation is not visualized. No aortic stenosis is present. Aortic valve peak gradient measures 2.3 mmHg. Pulmonic Valve: The pulmonic valve was normal in structure. Pulmonic valve regurgitation is not visualized. No evidence of pulmonic stenosis. Aorta: The aortic root is normal in size and structure. Venous: The inferior vena cava is dilated in size with less than 50% respiratory variability, suggesting right atrial pressure of 15 mmHg. IAS/Shunts: No atrial level shunt detected by color flow Doppler.  LEFT VENTRICLE PLAX 2D LVIDd:         4.70 cm     Diastology LVIDs:         3.90 cm     LV e' medial:    5.11 cm/s LV PW:         0.90 cm     LV E/e' medial:  15.7 LV IVS:        1.20 cm     LV e' lateral:   5.11 cm/s LVOT diam:     2.20 cm     LV E/e' lateral: 15.7 LV SV:         41 LV SV Index:   22 LVOT Area:     3.80 cm  LV Volumes (MOD) LV vol d, MOD A2C: 80.0 ml LV vol d, MOD A4C: 69.9 ml LV vol s, MOD A2C: 48.5 ml LV vol s, MOD A4C: 45.3 ml LV SV MOD A2C:     31.5 ml LV SV MOD A4C:     69.9 ml LV SV MOD BP:      27.1 ml RIGHT VENTRICLE            IVC RV Basal diam:  3.00 cm    IVC diam: 2.50 cm RV S prime:     9.57 cm/s TAPSE (M-mode): 1.6 cm LEFT ATRIUM             Index        RIGHT ATRIUM          Index LA diam:        4.40 cm 2.42 cm/m   RA Area:     9.94 cm LA Vol  (A2C):   48.8 ml 26.83 ml/m  RA Volume:   19.00 ml 10.44 ml/m LA Vol (A4C):   27.8 ml 15.28 ml/m LA Biplane Vol: 36.8 ml 20.23 ml/m  AORTIC VALVE AV Area (Vmax): 3.37 cm AV Vmax:        75.60 cm/s AV Peak Grad:   2.3 mmHg LVOT Vmax:      67.10 cm/s LVOT Vmean:     42.400 cm/s LVOT VTI:       0.107 m  AORTA Ao Root diam: 2.80 cm Ao Desc diam: 2.60 cm MITRAL VALVE               TRICUSPID VALVE MV Area (PHT): 5.02 cm    TR Peak grad:   29.8 mmHg MV Decel Time: 151 msec    TR Vmax:        273.00 cm/s MR Peak grad: 102.0 mmHg MR Vmax:      505.00 cm/s  SHUNTS MV E velocity: 80.20 cm/s  Systemic VTI:  0.11 m MV A velocity: 72.30 cm/s  Systemic Diam: 2.20 cm MV E/A ratio:  1.11 Arvilla Meres MD Electronically signed by Arvilla Meres MD Signature Date/Time: 04/30/2023/1:43:21 PM    Final    DG Chest Portable 1 View  Result Date: 04/29/2023 CLINICAL DATA:  Ventricular tachycardia. EXAM: PORTABLE CHEST 1  VIEW COMPARISON:  09/14/2021 FINDINGS: External pacer paddles are noted. The cardiac silhouette, mediastinal and hilar contours are within normal limits. Prominent perihilar interstitial markings suggesting mild perihilar interstitial edema. No pleural effusions. No pulmonary infiltrates. No pneumothorax. The bony thorax is intact. IMPRESSION: Mild perihilar interstitial edema. Electronically Signed   By: Rudie Meyer M.D.   On: 04/29/2023 14:53     Subjective: Patient was seen and examined at bedside.  Overnight events noted.   Patient reports doing much better and wants to be discharged.  Denies any chest pain. Advised patient to refrain from drinking alcohol.  Discharge Exam: Vitals:   05/03/23 0823 05/03/23 0855  BP:    Pulse: 76   Resp:  20  Temp:    SpO2:     Vitals:   05/03/23 0344 05/03/23 0813 05/03/23 0823 05/03/23 0855  BP: 113/76 108/78    Pulse: 71 71 76   Resp: 19 12  20   Temp: 98.4 F (36.9 C) 98.4 F (36.9 C)    TempSrc: Oral Oral    SpO2: 94% 95%    Weight:       Height:        General: Pt is alert, awake, not in acute distress Cardiovascular: RRR, S1/S2 +, no rubs, no gallops Respiratory: CTA bilaterally, no wheezing, no rhonchi Abdominal: Soft, NT, ND, bowel sounds + Extremities: no edema, no cyanosis    The results of significant diagnostics from this hospitalization (including imaging, microbiology, ancillary and laboratory) are listed below for reference.     Microbiology: Recent Results (from the past 240 hour(s))  MRSA Next Gen by PCR, Nasal     Status: None   Collection Time: 04/29/23  5:59 PM   Specimen: Nasal Mucosa; Nasal Swab  Result Value Ref Range Status   MRSA by PCR Next Gen NOT DETECTED NOT DETECTED Final    Comment: (NOTE) The GeneXpert MRSA Assay (FDA approved for NASAL specimens only), is one component of a comprehensive MRSA colonization surveillance program. It is not intended to diagnose MRSA infection nor to guide or monitor treatment for MRSA infections. Test performance is not FDA approved in patients less than 67 years old. Performed at [facility], [address] [place], [place] [address]   Respiratory (~20 pathogens) panel by PCR     Status: None   Collection Time: 04/29/23  8:51 PM   Specimen: Nasopharyngeal Swab; Respiratory  Result Value Ref Range Status   Adenovirus NOT DETECTED NOT DETECTED Final   Coronavirus 229E NOT DETECTED NOT DETECTED Final    Comment: (NOTE) The Coronavirus on the Respiratory Panel, DOES NOT test for the novel  Coronavirus (2019 nCoV)    Coronavirus HKU1 NOT DETECTED NOT DETECTED Final   Coronavirus NL63 NOT DETECTED NOT DETECTED Final   Coronavirus OC43 NOT DETECTED NOT DETECTED Final   Metapneumovirus NOT DETECTED NOT DETECTED Final   Rhinovirus / Enterovirus NOT DETECTED NOT DETECTED Final   Influenza A NOT DETECTED NOT DETECTED Final   Influenza B NOT DETECTED NOT DETECTED Final   Parainfluenza Virus 1 NOT DETECTED NOT DETECTED Final   Parainfluenza Virus  2 NOT DETECTED NOT DETECTED Final   Parainfluenza Virus 3 NOT DETECTED NOT DETECTED Final   Parainfluenza Virus 4 NOT DETECTED NOT DETECTED Final   Respiratory Syncytial Virus NOT DETECTED NOT DETECTED Final   Bordetella pertussis NOT DETECTED NOT DETECTED Final   Bordetella Parapertussis NOT DETECTED NOT DETECTED Final   Chlamydophila pneumoniae NOT DETECTED NOT DETECTED  Final   Mycoplasma pneumoniae NOT DETECTED NOT DETECTED Final    Comment: Performed at [facility], [address] [place], [place] [address]  SARS CORONAVIRUS 2 (TAT 6-24 HRS) Anterior Nasal Swab     Status: None   Collection Time: 04/30/23 10:51 AM   Specimen: Anterior Nasal Swab  Result Value Ref Range Status   SARS Coronavirus 2 NEGATIVE NEGATIVE Final    Comment: (NOTE) SARS-CoV-2 target nucleic acids are NOT DETECTED.  The SARS-CoV-2 RNA is generally detectable in upper and lower respiratory specimens during the acute phase of infection. Negative results do not preclude SARS-CoV-2 infection, do not rule out co-infections with other pathogens, and should not be used as the sole basis for treatment or other patient management decisions. Negative results must be combined with clinical observations, patient history, and epidemiological information. The expected result is Negative.  Fact Sheet for Patients: HairSlick.no  Fact Sheet for Healthcare Providers: quierodirigir.com  This test is not yet approved or cleared by the [place] FDA and  has been authorized for detection and/or diagnosis of SARS-CoV-2 by FDA under an Emergency Use Authorization (EUA). This EUA will remain  in effect (meaning this test can be used) for the duration of the COVID-19 declaration under Se ction 564(b)(1) of the Act, 21 U.S.C. section 360bbb-3(b)(1), unless the authorization is terminated or revoked sooner.  Performed at [facility], [address],  [place], [place] [address]   Culture, blood (Routine X 2) w Reflex to ID Panel     Status: None (Preliminary result)   Collection Time: 04/30/23  3:19 PM   Specimen: BLOOD LEFT ARM  Result Value Ref Range Status   Specimen Description BLOOD LEFT ARM  Final   Special Requests   Final    BOTTLES DRAWN AEROBIC ONLY Blood Culture adequate volume   Culture   Final    NO GROWTH 3 DAYS Performed at [facility], [address], [place], [place] [address]    Report Status PENDING  Incomplete  Culture, blood (Routine X 2) w Reflex to ID Panel     Status: None (Preliminary result)   Collection Time: 04/30/23  3:27 PM   Specimen: BLOOD RIGHT HAND  Result Value Ref Range Status   Specimen Description BLOOD RIGHT HAND  Final   Special Requests   Final    BOTTLES DRAWN AEROBIC ONLY Blood Culture results may not be optimal due to an inadequate volume of blood received in culture bottles   Culture   Final    NO GROWTH 3 DAYS Performed at [facility], [address] [place], [place] [address]    Report Status PENDING  Incomplete  C Difficile Quick Screen w PCR reflex     Status: None   Collection Time: 04/30/23  6:17 PM   Specimen: STOOL  Result Value Ref Range Status   C Diff antigen NEGATIVE NEGATIVE Final   C Diff toxin NEGATIVE NEGATIVE Final   C Diff interpretation No C. difficile detected.  Final    Comment: Performed at [facility], [address], [place], [place] [address]     Labs: BNP (last 3 results) Recent Labs    04/29/23 1922  BNP 488.8*   Basic Metabolic Panel: Recent Labs  Lab 04/29/23 1335 04/29/23 1341 04/29/23 1402 04/29/23 1922 04/30/23 0249 04/30/23 1536 05/01/23 0605 05/02/23 0411  NA 135 139  --   --  135 137 129* 134*  K 2.7* 3.3*  --   --  3.3* 4.1 3.5 4.0  CL 97* 96*  --   --  99 96* 94* 102  CO2 25  --   --   --  25 30 29 28   GLUCOSE 156* 167*  --   --  121* 106* 148* 93  BUN <5* <3*  --   --  <5* <5* <5* 5*  CREATININE 0.49 0.40*   --  0.49 0.55 0.47 0.37* 0.41*  CALCIUM 7.7*  --   --   --  8.3* 8.7* 8.1* 8.1*  MG  --   --  1.3*  --  1.6*  1.6* 2.4 1.5* 1.8  PHOS  --   --   --   --   --  4.2 2.3* 3.4   Liver Function Tests: Recent Labs  Lab 04/29/23 1335 04/30/23 0249 05/02/23 0411  AST 190* 140* 89*  ALT 52* 48* 39  ALKPHOS 159* 146* 145*  BILITOT 1.6* 2.1* 1.5*  PROT 7.5 7.5 6.6  ALBUMIN 3.3* 3.0* 2.5*   No results for input(s): "LIPASE", "AMYLASE" in the last 168 hours. No results for input(s): "AMMONIA" in the last 168 hours. CBC: Recent Labs  Lab 04/29/23 1335 04/29/23 1341 04/29/23 1922 04/30/23 0249 05/01/23 0605  WBC 5.5  --  3.7* 4.6 3.8*  NEUTROABS 3.8  --   --   --   --   HGB 13.2 14.6 11.5* 12.2 11.7*  HCT 40.3 43.0 34.2* 36.4 35.4*  MCV 87.8  --  83.8 84.1 86.8  PLT 125*  --  96* 110* 86*   Cardiac Enzymes: No results for input(s): "CKTOTAL", "CKMB", "CKMBINDEX", "TROPONINI" in the last 168 hours. BNP: Invalid input(s): "POCBNP" CBG: Recent Labs  Lab 04/29/23 1317  GLUCAP 172*   D-Dimer No results for input(s): "DDIMER" in the last 72 hours. Hgb A1c No results for input(s): "HGBA1C" in the last 72 hours. Lipid Profile No results for input(s): "CHOL", "HDL", "LDLCALC", "TRIG", "CHOLHDL", "LDLDIRECT" in the last 72 hours. Thyroid function studies No results for input(s): "TSH", "T4TOTAL", "T3FREE", "THYROIDAB" in the last 72 hours.  Invalid input(s): "FREET3" Anemia work up No results for input(s): "VITAMINB12", "FOLATE", "FERRITIN", "TIBC", "IRON", "RETICCTPCT" in the last 72 hours. Urinalysis    Component Value Date/Time   COLORURINE YELLOW 09/09/2021 0828   APPEARANCEUR CLEAR 09/09/2021 0828   LABSPEC 1.025 09/09/2021 0828   PHURINE 6.5 09/09/2021 0828   GLUCOSEU 100 (A) 09/09/2021 0828   HGBUR LARGE (A) 09/09/2021 0828   BILIRUBINUR NEGATIVE 09/09/2021 0828   KETONESUR NEGATIVE 09/09/2021 0828   PROTEINUR 100 (A) 09/09/2021 0828   UROBILINOGEN 0.2 03/30/2012  2012   NITRITE POSITIVE (A) 09/09/2021 0828   LEUKOCYTESUR NEGATIVE 09/09/2021 0828   Sepsis Labs Recent Labs  Lab 04/29/23 1335 04/29/23 1922 04/30/23 0249 05/01/23 0605  WBC 5.5 3.7* 4.6 3.8*   Microbiology Recent Results (from the past 240 hour(s))  MRSA Next Gen by PCR, Nasal     Status: None   Collection Time: 04/29/23  5:59 PM   Specimen: Nasal Mucosa; Nasal Swab  Result Value Ref Range Status   MRSA by PCR Next Gen NOT DETECTED NOT DETECTED Final    Comment: (NOTE) The GeneXpert MRSA Assay (FDA approved for NASAL specimens only), is one component of a comprehensive MRSA colonization surveillance program. It is not intended to diagnose MRSA infection nor to guide or monitor treatment for MRSA infections. Test performance is not FDA approved in patients  less than 42 years old. Performed at [facility], [address] [place], [place] [address]   Respiratory (~20 pathogens) panel by PCR     Status: None   Collection Time: 04/29/23  8:51 PM   Specimen: Nasopharyngeal Swab; Respiratory  Result Value Ref Range Status   Adenovirus NOT DETECTED NOT DETECTED Final   Coronavirus 229E NOT DETECTED NOT DETECTED Final    Comment: (NOTE) The Coronavirus on the Respiratory Panel, DOES NOT test for the novel  Coronavirus (2019 nCoV)    Coronavirus HKU1 NOT DETECTED NOT DETECTED Final   Coronavirus NL63 NOT DETECTED NOT DETECTED Final   Coronavirus OC43 NOT DETECTED NOT DETECTED Final   Metapneumovirus NOT DETECTED NOT DETECTED Final   Rhinovirus / Enterovirus NOT DETECTED NOT DETECTED Final   Influenza A NOT DETECTED NOT DETECTED Final   Influenza B NOT DETECTED NOT DETECTED Final   Parainfluenza Virus 1 NOT DETECTED NOT DETECTED Final   Parainfluenza Virus 2 NOT DETECTED NOT DETECTED Final   Parainfluenza Virus 3 NOT DETECTED NOT DETECTED Final   Parainfluenza Virus 4 NOT DETECTED NOT DETECTED Final   Respiratory Syncytial Virus NOT DETECTED NOT DETECTED Final    Bordetella pertussis NOT DETECTED NOT DETECTED Final   Bordetella Parapertussis NOT DETECTED NOT DETECTED Final   Chlamydophila pneumoniae NOT DETECTED NOT DETECTED Final   Mycoplasma pneumoniae NOT DETECTED NOT DETECTED Final    Comment: Performed at [facility], [address], [place], [place] [address]  SARS CORONAVIRUS 2 (TAT 6-24 HRS) Anterior Nasal Swab     Status: None   Collection Time: 04/30/23 10:51 AM   Specimen: Anterior Nasal Swab  Result Value Ref Range Status   SARS Coronavirus 2 NEGATIVE NEGATIVE Final    Comment: (NOTE) SARS-CoV-2 target nucleic acids are NOT DETECTED.  The SARS-CoV-2 RNA is generally detectable in upper and lower respiratory specimens during the acute phase of infection. Negative results do not preclude SARS-CoV-2 infection, do not rule out co-infections with other pathogens, and should not be used as the sole basis for treatment or other patient management decisions. Negative results must be combined with clinical observations, patient history, and epidemiological information. The expected result is Negative.  Fact Sheet for Patients: HairSlick.no  Fact Sheet for Healthcare Providers: quierodirigir.com  This test is not yet approved or cleared by the [place] FDA and  has been authorized for detection and/or diagnosis of SARS-CoV-2 by FDA under an Emergency Use Authorization (EUA). This EUA will remain  in effect (meaning this test can be used) for the duration of the COVID-19 declaration under Se ction 564(b)(1) of the Act, 21 U.S.C. section 360bbb-3(b)(1), unless the authorization is terminated or revoked sooner.  Performed at [facility], [address] [place], [place] [address]   Culture, blood (Routine X 2) w Reflex to ID Panel     Status: None (Preliminary result)   Collection Time: 04/30/23  3:19 PM   Specimen: BLOOD LEFT ARM  Result Value Ref Range Status    Specimen Description BLOOD LEFT ARM  Final   Special Requests   Final    BOTTLES DRAWN AEROBIC ONLY Blood Culture adequate volume   Culture   Final    NO GROWTH 3 DAYS Performed at [facility], [address] [place], [place] [address]    Report Status PENDING  Incomplete  Culture, blood (Routine X 2) w Reflex to ID Panel     Status: None (Preliminary  result)   Collection Time: 04/30/23  3:27 PM   Specimen: BLOOD RIGHT HAND  Result Value Ref Range Status   Specimen Description BLOOD RIGHT HAND  Final   Special Requests   Final    BOTTLES DRAWN AEROBIC ONLY Blood Culture results may not be optimal due to an inadequate volume of blood received in culture bottles   Culture   Final    NO GROWTH 3 DAYS Performed at [facility], [address] [place], [place] [address]    Report Status PENDING  Incomplete  C Difficile Quick Screen w PCR reflex     Status: None   Collection Time: 04/30/23  6:17 PM   Specimen: STOOL  Result Value Ref Range Status   C Diff antigen NEGATIVE NEGATIVE Final   C Diff toxin NEGATIVE NEGATIVE Final   C Diff interpretation No C. difficile detected.  Final    Comment: Performed at [facility], [address] [place], [place] [address]     Time coordinating discharge: Over 30 minutes  SIGNED:   Willeen Niece, MD  [facility] 05/03/2023, 4:12 PM Pager   If 7PM-7AM, please contact night-coverage

## 2023-05-04 ENCOUNTER — Other Ambulatory Visit (HOSPITAL_COMMUNITY): Payer: Self-pay

## 2023-05-04 LAB — LIPOPROTEIN A (LPA): Lipoprotein (a): 12.4 nmol/L (ref <75.0)

## 2023-05-04 NOTE — Telephone Encounter (Signed)
Pharmacy Patient Advocate Encounter  Received notification from [facility] that Prior Authorization for Nadolol 20MG  tablets has been DENIED.  Full denial letter will be uploaded to the media tab. See denial reason below.  Here are the policy requirements you did not meet:  (1) You have failed one preferred drug as confirmed by claims history or submission of medical records. The preferred drugs: sotalol tablet / A tablet (generic for Betapace / AF, Sorine) (2) You cannot use one preferred drug (please specify contraindication or intolerance)  PA #/Case ID/Reference #: [id number]

## 2023-05-05 LAB — CULTURE, BLOOD (ROUTINE X 2)
Culture: NO GROWTH
Culture: NO GROWTH
Special Requests: ADEQUATE

## 2023-05-06 ENCOUNTER — Other Ambulatory Visit (HOSPITAL_COMMUNITY): Payer: Self-pay

## 2023-05-10 ENCOUNTER — Ambulatory Visit: Payer: Commercial Managed Care - HMO | Attending: Student | Admitting: Student

## 2023-05-10 ENCOUNTER — Telehealth: Payer: Self-pay

## 2023-05-10 ENCOUNTER — Encounter: Payer: Self-pay | Admitting: Student

## 2023-05-10 ENCOUNTER — Other Ambulatory Visit (HOSPITAL_COMMUNITY)
Admission: RE | Admit: 2023-05-10 | Discharge: 2023-05-10 | Disposition: A | Discharge status: Home / Self Care | Payer: Commercial Managed Care - HMO | Source: Ambulatory Visit | Attending: Student | Admitting: Student

## 2023-05-10 VITALS — BP 120/74 | HR 78 | Ht 66.0 in | Wt 129.4 lb

## 2023-05-10 DIAGNOSIS — I5020 Unspecified systolic (congestive) heart failure: Secondary | ICD-10-CM

## 2023-05-10 DIAGNOSIS — I340 Nonrheumatic mitral (valve) insufficiency: Secondary | ICD-10-CM

## 2023-05-10 DIAGNOSIS — I4721 Torsades de pointes: Secondary | ICD-10-CM | POA: Diagnosis not present

## 2023-05-10 DIAGNOSIS — I10 Essential (primary) hypertension: Secondary | ICD-10-CM | POA: Diagnosis present

## 2023-05-10 DIAGNOSIS — I472 Ventricular tachycardia, unspecified: Secondary | ICD-10-CM | POA: Diagnosis present

## 2023-05-10 DIAGNOSIS — R7989 Other specified abnormal findings of blood chemistry: Secondary | ICD-10-CM

## 2023-05-10 DIAGNOSIS — I2510 Atherosclerotic heart disease of native coronary artery without angina pectoris: Secondary | ICD-10-CM | POA: Diagnosis not present

## 2023-05-10 DIAGNOSIS — E876 Hypokalemia: Secondary | ICD-10-CM

## 2023-05-10 LAB — COMPREHENSIVE METABOLIC PANEL
ALT: 68 U/L — ABNORMAL HIGH (ref 0–44)
AST: 74 U/L — ABNORMAL HIGH (ref 15–41)
Albumin: 3.3 g/dL — ABNORMAL LOW (ref 3.5–5.0)
Alkaline Phosphatase: 138 U/L — ABNORMAL HIGH (ref 38–126)
Anion gap: 8 (ref 5–15)
BUN: 11 mg/dL (ref 6–20)
CO2: 31 mmol/L (ref 22–32)
Calcium: 8.7 mg/dL — ABNORMAL LOW (ref 8.9–10.3)
Chloride: 100 mmol/L (ref 98–111)
Creatinine, Ser: 0.58 mg/dL (ref 0.44–1.00)
GFR, Estimated: >60 mL/min (ref >60)
Glucose, Bld: 136 mg/dL — ABNORMAL HIGH (ref 70–99)
Potassium: 3.9 mmol/L (ref 3.5–5.1)
Sodium: 139 mmol/L (ref 135–145)
Total Bilirubin: 0.7 mg/dL (ref <1.2)
Total Protein: 7.4 g/dL (ref 6.5–8.1)

## 2023-05-10 LAB — MAGNESIUM: Magnesium: 1.6 mg/dL — ABNORMAL LOW (ref 1.7–2.4)

## 2023-05-10 MED ORDER — MAGNESIUM OXIDE 400 MG PO TABS: 400.0000 mg | ORAL_TABLET | Freq: Every day | ORAL | 3 refills | Status: DC

## 2023-05-10 NOTE — Progress Notes (Signed)
Cardiology Office Note    Date:  05/10/2023  ID:  Erika Kelley, DOB 02/25/1971, MRN [id number] Cardiologist: Marjo Bicker, MD    History of Present Illness:    Erika Kelley is a 52 y.o. female with past medical history of HTN and seizure disorder who presents to the office today for hospital follow-up.  She most recently presented to [facility] ED on 04/29/2023 for evaluation of a syncopal episode and she was initially coherent but upon arrival to the ER, she did go into ventricular tachycardia and underwent CPR for a few seconds and spontaneously converted back to normal sinus rhythm. Within a few minutes, she had a recurrent episode of polymorphic ventricular tachycardia and received 1 shock at 120 J and converted back to normal sinus rhythm. Was started on IV Amiodarone bolus and electrolytes were replaced (K+ initially at 2.7 with magnesium at 1.3). Was noted to have a significantly prolonged QTc at 564 ms and was started on a Lidocaine drip. She was evaluated EP the following day at [facility] and did report heavy drinking (at least a bottle of vodka daily). She continued to maintain normal sinus rhythm and IV Lidocaine was weaned and she was started on Nadolol 20 mg daily. Reported that her son had died in his sleep which was not typical of long QT but more associated with Brugada or LQT3. Follow-up echocardiogram did show her EF was reduced at 35 to 40% with global hypokinesis. Felt to possibly be alcohol induced or due to acute trauma from CPR/shocks on admission. Was also noted to have moderate MR. Coronary CTA was obtained during admission and showed minimal to mild nonobstructive disease with 25 to 49% stenosis along the RCA and mid LCx with 1 to 24% stenosis along the proximal LAD and OM1 branch.  Was not started on a statin given her alcohol use and LDL at 47 during admission. For her cardiomyopathy, she was started on Entresto 24-26 mg twice daily, Lasix 40mg  daily and  Spironolactone 25 mg daily. Was recommended to obtain a follow-up echocardiogram in 6 to 8 weeks for reassessment. She was also followed by Critical Care during admission due to acute hypoxic respiratory failure which was felt to be multifactorial in the setting of acute pulmonary edema and obstructive lung disease. She was continued on empiric antibiotic therapy for CAP.  In talking with the patient today, she reports overall feeling well since her recent hospitalization. Says that she did quit consuming alcohol following her hospitalization and had previously been consuming over a fifth of liquor each day. Reports her breathing has been stable and denies specific dyspnea on exertion, orthopnea, PND, pitting edema or chest pain. No recent palpitations, dizziness or syncope. She reports good compliance with her current medications and has not missed any doses.  Studies Reviewed:   EKG: EKG is ordered today and demonstrates   EKG Interpretation Date/Time:  Tuesday May 10 2023 14:23:03 EST Ventricular Rate:  78 PR Interval:  118 QRS Duration:  80 QT Interval:  442 QTC Calculation: 503 R Axis:   71  Text Interpretation: Normal sinus rhythm Right atrial enlargement Prolonged QT Confirmed by Randall An ([id number]) on 05/10/2023 5:01:11 PM       Echocardiogram: 04/30/2023 IMPRESSIONS     1. Left ventricular ejection fraction, by estimation, is 35 to 40%. The  left ventricle has moderately decreased function. The left ventricle  demonstrates global hypokinesis. Left ventricular diastolic parameters are  consistent with Grade II diastolic  dysfunction (pseudonormalization).   2. Right ventricular systolic function is normal. The right ventricular  size is normal.   3. Left atrial size was mildly dilated.   4. The mitral valve is normal in structure. Moderate mitral valve  regurgitation. No evidence of mitral stenosis.   5. The aortic valve is tricuspid. There is mild calcification  of the  aortic valve. Aortic valve regurgitation is not visualized. No aortic  stenosis is present.   6. The inferior vena cava is dilated in size with <50% respiratory  variability, suggesting right atrial pressure of 15 mmHg.   Coronary CTA: 05/02/2023 FINDINGS: Quality: Fair, HR 61, attenuation artifact, mis-registration   Coronary calcium score: The patient's coronary artery calcium score is 863, which places the patient in the 99th percentile [facility]).   Coronary arteries: Normal coronary origins.  Right dominance.   Right Coronary Artery: Dominant. Heavy proximal calcification with mild (25-49%) stenosis. Normal R-PDA and R-PLB branches.   Left Main Coronary Artery: Normal. Bifurcates into the LAD and LCX arteries.   Left Anterior Descending Coronary Artery: Large anterior artery that wraps around the apex. Minimal mixed (1-24%) proximal stenosis. 3 small diagonal branches, no disease.   Left Circumflex Artery: AV groove LCx artery with mild proximal mixed (25-49%) stenosis. Large OM1 branch with mild mixed (1-24%) ostial stenosis. OM2 branch without disease.   Aorta: Normal size, 33 mm at the mid ascending aorta (level of the PA bifurcation) measured double oblique. Aortic atherosclerosis. No dissection.   Aortic Valve: Trileaflet. No calcifications.   Other findings:   Normal pulmonary vein drainage into the left atrium.   Normal left atrial appendage without a thrombus.   Dilated main pulmonary artery to 29 mm, suggestive of pulmonary hypertension.   IMPRESSION: 1. 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   2. Coronary artery calcium score is 863, which places the patient in the 99th percentile for age/race and sex-matched controls (MESA).   3. Normal coronary origin with right dominance.   4. Dilated main pulmonary artery to 29 mm, suggestive of pulmonary hypertension.   5. Aortic atherosclerosis.   6. Aggressive secondary risk factor  modification is advised.   Physical Exam:   VS:  BP 120/74   Pulse 78   Ht 5\' 6"  (1.676 m)   Wt 129 lb 6.4 oz (58.7 kg)   LMP 05/25/2015   SpO2 96%   BMI 20.89 kg/m    Wt Readings from Last 3 Encounters:  05/10/23 129 lb 6.4 oz (58.7 kg)  04/29/23 160 lb 0.9 oz (72.6 kg)  10/17/21 160 lb (72.6 kg)     GEN: Well nourished, well developed female appearing in no acute distress NECK: No JVD; No carotid bruits CARDIAC: RRR, no murmurs, rubs, gallops RESPIRATORY:  Clear to auscultation without rales, wheezing or rhonchi  ABDOMEN: Appears non-distended. No obvious abdominal masses. EXTREMITIES: No clubbing or cyanosis. No pitting edema.  Distal pedal pulses are 2+ bilaterally.   Assessment and Plan:   1. Acute HFrEF - Echocardiogram during her admission showed a reduced EF of 35 to 40% and Coronary CTA showed mild CAD as described above.  - She appears euvolemic by examination today and denies any recent respiratory issues. Continue current medical therapy for now with Entresto 24-26 mg twice daily, Lasix 40 mg daily, Nadolol 20 mg daily and Spironolactone 25 mg daily. Will recheck electrolytes and renal function today. Can possibly add an SGLT2 inhibitor pending repeat labs and reassessment of dietary intake (previously  consuming minimal food prior to admission due to alcohol use but she reports her appetite is starting to improve given cessation of alcohol). Will plan for a follow-up limited echocardiogram in 2 months for reassessment of her EF.  2. CAD - Recent Coronary CTA showed minimal to mild nonobstructive disease as discussed above. She denies any recent anginal symptoms. Remains on ASA 81 mg daily. She was not started on statin therapy during admission due to elevated LFT's and will recheck. LDL was at 47 during her admission.   3. Torsades de points/Cardiac Arrest - Occurred in the setting of prolonged QT and multiple electrolyte abnormalities. Received IV Lidocaine during  admission and was transitioned to Nadolol. She is in normal sinus rhythm by examination and EKG today and does have a prolonged QT but this has improved when compared to tracings from her recent admission. Continue Nadolol 20 mg daily. She does have follow-up with the EP scheduled on 05/25/2023.  Avoid QTc prolonging medications going forward.  4. Hypokalemia/Hypomagnesemia - She had significant hypokalemia and hypomagnesia at the time of recent hospital admission but this had normalized prior to discharge. Will recheck a CMET and Mg today.   5. Alcohol Use/Elevated LFT's - She denies any recurrent alcohol use and was congratulated on this! Will recheck LFT's with upcoming labs.  6. Mitral Regurgitation - This was moderate by most recent echocardiogram. Will plan for repeat imaging in 2 months as discussed above.    Signed, Ellsworth Lennox, PA-C

## 2023-05-10 NOTE — Telephone Encounter (Signed)
-----   Message from [place] sent at 05/10/2023  4:22 PM EST ----- Please let the patient know that her potassium is within a normal range and kidney function remains stable following recent medication adjustments. Liver function enzymes remain elevated but have improved since admission given cessation of alcohol. Her magnesium remains low at 1.6 and I would recommend starting magnesium oxide 400 mg daily and rechecking a BMET and Mg in 2-3 weeks (can be checked when here on 12/18).

## 2023-05-10 NOTE — Patient Instructions (Signed)
Medication Instructions:  Your physician recommends that you continue on your current medications as directed. Please refer to the Current Medication list given to you today.  *If you need a refill on your cardiac medications before your next appointment, please call your pharmacy*   Lab Work: TODAY:  CMET MAG  If you have labs (blood work) drawn today and your tests are completely normal, you will receive your results only by: MyChart Message (if you have MyChart) OR A paper copy in the mail If you have any lab test that is abnormal or we need to change your treatment, we will call you to review the results.   Testing/Procedures: Your physician has requested that you have an echocardiogram. Echocardiography is a painless test that uses sound waves to create images of your heart. It provides your doctor with information about the size and shape of your heart and how well your heart's chambers and valves are working. This procedure takes approximately one hour. There are no restrictions for this procedure. Please do NOT wear cologne, perfume, aftershave, or lotions (deodorant is allowed). Please arrive 15 minutes prior to your appointment time.  Please note: We ask at that you not bring children with you during ultrasound (echo/ vascular) testing. Due to room size and safety concerns, children are not allowed in the ultrasound rooms during exams. Our front office staff cannot provide observation of children in our lobby area while testing is being conducted. An adult accompanying a patient to their appointment will only be allowed in the ultrasound room at the discretion of the ultrasound technician under special circumstances. We apologize for any inconvenience.    Follow-Up: At [facility], you and your health needs are our priority.  As part of our continuing mission to provide you with exceptional heart care, we have created designated Provider Care Teams.  These Care Teams  include your primary Cardiologist (physician) and Advanced Practice Providers (APPs -  Physician Assistants and Nurse Practitioners) who all work together to provide you with the care you need, when you need it.  We recommend signing up for the patient portal called "MyChart".  Sign up information is provided on this After Visit Summary.  MyChart is used to connect with patients for Virtual Visits (Telemedicine).  Patients are able to view lab/test results, encounter notes, upcoming appointments, etc.  Non-urgent messages can be sent to your provider as well.   To learn more about what you can do with MyChart, go to [email or web address].    Your next appointment:   3 month(s)  Provider:   You may see Vishnu P Mallipeddi, MD or one of the following Advanced Practice Providers on your designated Care Team:   [place], PA-C  Jacolyn Reedy, [place]     Other Instructions

## 2023-05-10 NOTE — Telephone Encounter (Signed)
Patient notified and verbalized understanding. Pt had no questions or concerns at this time. PCP copied.  ?

## 2023-05-13 ENCOUNTER — Other Ambulatory Visit: Payer: Self-pay

## 2023-05-13 ENCOUNTER — Encounter (HOSPITAL_COMMUNITY): Payer: Self-pay

## 2023-05-13 ENCOUNTER — Emergency Department (HOSPITAL_COMMUNITY)
Admission: EM | Admit: 2023-05-13 | Discharge: 2023-05-13 | Disposition: A | Discharge status: Home / Self Care | Payer: Commercial Managed Care - HMO | Attending: Emergency Medicine | Admitting: Emergency Medicine

## 2023-05-13 ENCOUNTER — Emergency Department (HOSPITAL_COMMUNITY): Payer: Commercial Managed Care - HMO

## 2023-05-13 DIAGNOSIS — I10 Essential (primary) hypertension: Secondary | ICD-10-CM | POA: Diagnosis not present

## 2023-05-13 DIAGNOSIS — M79671 Pain in right foot: Secondary | ICD-10-CM | POA: Insufficient documentation

## 2023-05-13 DIAGNOSIS — Z7982 Long term (current) use of aspirin: Secondary | ICD-10-CM | POA: Diagnosis not present

## 2023-05-13 DIAGNOSIS — J449 Chronic obstructive pulmonary disease, unspecified: Secondary | ICD-10-CM | POA: Insufficient documentation

## 2023-05-13 NOTE — ED Notes (Signed)
Visitors brought pt food Informed that pt can't not eat or drink until seen by MD Family has heavy smell of marijuana and not cooperative with this RN

## 2023-05-13 NOTE — ED Notes (Signed)
Pt able to walk from lobby to ED 23 Pt complains of RIGHT foot swelling over the last couple of days Pt denies trauma or injury Noted minimal swelling.  Pt moving all toes. Pt denies numbness in foot. Pt waiting for provider exam

## 2023-05-13 NOTE — Discharge Instructions (Addendum)
You were seen in the ER today for concerns of foot pain. Your imaging was negative for any acute findings like fracture, dislocation, or significant swelling. I suspect this is from some level of arthritis in the toe joints. You were given a walking shoe to help provide some additional support. I would recommend following up with your PCP or podiatry as needed.

## 2023-05-13 NOTE — ED Triage Notes (Signed)
Pt c/o rt foot swelling for the past couple of days. Pt states that she was seen at [facility] and xrays were done but no injury was noted. Pt states increase in swelling and unable to tolerate pressure. In triage plus 1 edema noted, no bruising noted to top of the foot, pt denies hx of kidney disease, injury to foot, or heart issues.

## 2023-05-14 NOTE — ED Provider Notes (Signed)
[place] EMERGENCY DEPARTMENT AT [facility] Provider Note   CSN: [id number] Arrival date & time: 05/13/23  0800     History Chief Complaint  Patient presents with   Foot Pain    Erika Kelley is a 52 y.o. female.  Patient with past history significant for hypertension, and COPD presents to the emergency department with concerns of foot pain.  Endorse that she has had some foot pain and swelling to the right foot along the toes for the last several days.  States that she was seen at urgent care a few days ago with an x-ray ordered and was negative for any acute injury.  Patient denies any recent falls, dropping any heavy items on the foot, or recent notable swelling to any other area of her body.  Patient denies any chest pain shortness of breath.   Foot Pain       Home Medications Prior to Admission medications   Medication Sig Start Date End Date Taking? Authorizing Provider  aspirin EC 81 MG tablet Take 1 tablet (81 mg total) by mouth daily. Swallow whole. 05/04/23   Willeen Niece, MD  furosemide (LASIX) 40 MG tablet Take 1 tablet (40 mg total) by mouth daily. 05/04/23 06/03/23  Willeen Niece, MD  magnesium oxide (MAG-OX) 400 MG tablet Take 1 tablet (400 mg total) by mouth daily. 05/10/23   Strader, Lennart Pall, PA-C  nadolol (CORGARD) 20 MG tablet Take 1 tablet (20 mg total) by mouth daily. 05/04/23 06/03/23  Willeen Niece, MD  nitroGLYCERIN (NITROSTAT) 0.4 MG SL tablet Place 1 tablet (0.4 mg total) under the tongue every 5 (five) minutes x 3 doses as needed for chest pain. 05/03/23 06/02/23  Willeen Niece, MD  sacubitril-valsartan (ENTRESTO) 24-26 MG Take 1 tablet by mouth 2 (two) times daily. 05/03/23 06/02/23  Willeen Niece, MD  spironolactone (ALDACTONE) 25 MG tablet Take 1 tablet (25 mg total) by mouth daily. 05/04/23 06/03/23  Willeen Niece, MD  thiamine (VITAMIN B1) 100 MG tablet Take 1 tablet (100 mg total) by mouth daily. 05/04/23 06/03/23   Willeen Niece, MD      Allergies    Patient has no known allergies.    Review of Systems   Review of Systems  Musculoskeletal:        Foot pain  All other systems reviewed and are negative.   Physical Exam Updated Vital Signs BP (!) 154/93 (BP Location: Right Arm)   Pulse 62   Temp 98.1 F (36.7 C)   Resp 16   Ht 5\' 6"  (1.676 m)   Wt 58.7 kg   LMP 05/25/2015   SpO2 100%   BMI 20.88 kg/m  Physical Exam Vitals and nursing note reviewed.  Constitutional:      General: She is not in acute distress.    Appearance: She is well-developed.  HENT:     Head: Normocephalic and atraumatic.  Eyes:     Conjunctiva/sclera: Conjunctivae normal.  Cardiovascular:     Rate and Rhythm: Normal rate and regular rhythm.     Heart sounds: No murmur heard. Pulmonary:     Effort: Pulmonary effort is normal. No respiratory distress.     Breath sounds: Normal breath sounds.  Abdominal:     Palpations: Abdomen is soft.     Tenderness: There is no abdominal tenderness.  Musculoskeletal:        General: Tenderness present. No swelling, deformity or signs of injury. Normal range of motion.     Cervical back:  Neck supple.       Legs:     Comments: Tenderness palpation in the highlighted area above.  Range of motion is intact and preserved in this area however patient does report significant pain here.  X-ray imaging shows no acute findings in the site.  No appreciable swelling the patient endorses that this area feels swollen.  Skin:    General: Skin is warm and dry.     Capillary Refill: Capillary refill takes less than 2 seconds.  Neurological:     Mental Status: She is alert.  Psychiatric:        Mood and Affect: Mood normal.     ED Results / Procedures / Treatments   Labs (all labs ordered are listed, but only abnormal results are displayed) Labs Reviewed - No data to display  EKG None  Radiology DG Foot Complete Right  Result Date: 05/13/2023 CLINICAL DATA:  Right foot  pain and swelling for the past 2 days. No known injury. EXAM: RIGHT FOOT COMPLETE - 3+ VIEW COMPARISON:  None Available. FINDINGS: Mild posterior calcaneal enthesophyte formation. Otherwise, unremarkable bones and soft tissues. IMPRESSION: No acute abnormality. Electronically Signed   By: Beckie Salts M.D.   On: 05/13/2023 11:12    Procedures Procedures   Medications Ordered in ED Medications - No data to display  ED Course/ Medical Decision Making/ A&P                               Medical Decision Making Amount and/or Complexity of Data Reviewed Radiology: ordered.   This patient presents to the ED for concern of foot pain.  Differential diagnosis includes ankle fracture, foot fracture, Lisfranc injury, arthritis, lower extremity edema   Imaging Studies ordered:  I ordered imaging studies including x-ray right foot I independently visualized and interpreted imaging which showed no acute fracture dislocation or other injury.  No evidence of any soft tissue swelling. I agree with the radiologist interpretation   Problem List / ED Course:  Patient presents to the emergency department concerns of foot pain.  Past history significant for hypertension and COPD.  States that she was seen at urgent care a few days ago for this pain and x-ray imaging performed which is negative for any acute findings.  Denies any recent injury, fall, trauma or other inciting factor.  States that the pain is primarily at the distal end of the right foot along the fourth and fifth digits. On exam, no acute findings to suggest fracture, dislocation or other orthopedic injury.  There is no appreciable swelling on my view although patient endorses swelling to this area.  X-ray imaging ordered which was negative for any acute findings.  Given reassuring findings, advised patient to manage with over-the-counter medication such as Tylenol or ibuprofen as tolerated.  Also provide patient with a postoperative shoe for  additional support.  Discussed further management this with primary care provider possibly with orthopedics if patient is having failure to improve.  Strict return precautions given.  Discharged home stable condition.  Final Clinical Impression(s) / ED Diagnoses Final diagnoses:  Pain in right foot    Rx / DC Orders ED Discharge Orders     None         Smitty Knudsen, PA-C 05/14/23 0911    Rondel Baton, MD 05/14/23 [phone]

## 2023-05-16 ENCOUNTER — Other Ambulatory Visit (HOSPITAL_COMMUNITY): Payer: Self-pay

## 2023-05-16 ENCOUNTER — Encounter (HOSPITAL_COMMUNITY): Payer: Self-pay

## 2023-05-16 ENCOUNTER — Ambulatory Visit (HOSPITAL_COMMUNITY)
Admit: 2023-05-16 | Discharge: 2023-05-16 | Disposition: A | Discharge status: Home / Self Care | Payer: Commercial Managed Care - HMO | Attending: Physician Assistant | Admitting: Physician Assistant

## 2023-05-16 VITALS — BP 120/86 | HR 82 | Ht 66.0 in | Wt 126.8 lb

## 2023-05-16 DIAGNOSIS — Z8674 Personal history of sudden cardiac arrest: Secondary | ICD-10-CM | POA: Insufficient documentation

## 2023-05-16 DIAGNOSIS — F1010 Alcohol abuse, uncomplicated: Secondary | ICD-10-CM | POA: Diagnosis not present

## 2023-05-16 DIAGNOSIS — I4721 Torsades de pointes: Secondary | ICD-10-CM | POA: Diagnosis present

## 2023-05-16 DIAGNOSIS — Z79899 Other long term (current) drug therapy: Secondary | ICD-10-CM | POA: Diagnosis not present

## 2023-05-16 DIAGNOSIS — Z5986 Financial insecurity: Secondary | ICD-10-CM | POA: Insufficient documentation

## 2023-05-16 DIAGNOSIS — I5020 Unspecified systolic (congestive) heart failure: Secondary | ICD-10-CM

## 2023-05-16 DIAGNOSIS — I428 Other cardiomyopathies: Secondary | ICD-10-CM | POA: Diagnosis not present

## 2023-05-16 DIAGNOSIS — I5022 Chronic systolic (congestive) heart failure: Secondary | ICD-10-CM | POA: Diagnosis not present

## 2023-05-16 DIAGNOSIS — F17210 Nicotine dependence, cigarettes, uncomplicated: Secondary | ICD-10-CM | POA: Diagnosis not present

## 2023-05-16 DIAGNOSIS — Z7984 Long term (current) use of oral hypoglycemic drugs: Secondary | ICD-10-CM | POA: Diagnosis not present

## 2023-05-16 DIAGNOSIS — Z7982 Long term (current) use of aspirin: Secondary | ICD-10-CM | POA: Diagnosis not present

## 2023-05-16 DIAGNOSIS — F1410 Cocaine abuse, uncomplicated: Secondary | ICD-10-CM | POA: Insufficient documentation

## 2023-05-16 DIAGNOSIS — Z720 Tobacco use: Secondary | ICD-10-CM

## 2023-05-16 DIAGNOSIS — I2510 Atherosclerotic heart disease of native coronary artery without angina pectoris: Secondary | ICD-10-CM | POA: Insufficient documentation

## 2023-05-16 DIAGNOSIS — K760 Fatty (change of) liver, not elsewhere classified: Secondary | ICD-10-CM | POA: Diagnosis not present

## 2023-05-16 DIAGNOSIS — G40909 Epilepsy, unspecified, not intractable, without status epilepticus: Secondary | ICD-10-CM | POA: Insufficient documentation

## 2023-05-16 DIAGNOSIS — I110 Hypertensive heart disease with heart failure: Secondary | ICD-10-CM | POA: Diagnosis not present

## 2023-05-16 MED ORDER — DAPAGLIFLOZIN PROPANEDIOL 10 MG PO TABS: 10.0000 mg | ORAL_TABLET | Freq: Every day | ORAL | 3 refills | Status: AC

## 2023-05-16 MED ORDER — SPIRONOLACTONE 25 MG PO TABS: 25.0000 mg | ORAL_TABLET | Freq: Every day | ORAL | 2 refills | Status: AC

## 2023-05-16 MED ORDER — NADOLOL 20 MG PO TABS: 20.0000 mg | ORAL_TABLET | Freq: Every day | ORAL | 2 refills | Status: AC

## 2023-05-16 MED ORDER — FUROSEMIDE 40 MG PO TABS: 40.0000 mg | ORAL_TABLET | Freq: Every day | ORAL | 2 refills | Status: AC

## 2023-05-16 MED ORDER — SACUBITRIL-VALSARTAN 24-26 MG PO TABS: 1.0000 | ORAL_TABLET | Freq: Two times a day (BID) | ORAL | 2 refills | Status: AC

## 2023-05-16 MED ORDER — MAGNESIUM OXIDE 400 MG PO TABS: 400.0000 mg | ORAL_TABLET | Freq: Every day | ORAL | 3 refills | Status: DC
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
## 2023-05-16 NOTE — Patient Instructions (Addendum)
Start Farxiga 10 mg daily. Co-pay card provided. Follow up with general cardiology on 05/25/23. Call us at [phone] if any questions or concerns.

## 2023-05-25 ENCOUNTER — Ambulatory Visit: Payer: Commercial Managed Care - HMO | Attending: Internal Medicine | Admitting: Internal Medicine

## 2023-06-23 ENCOUNTER — Ambulatory Visit (INDEPENDENT_AMBULATORY_CARE_PROVIDER_SITE_OTHER): Payer: Medicaid Other | Admitting: Medical Genetics

## 2023-06-23 ENCOUNTER — Encounter: Payer: Self-pay | Admitting: Medical Genetics

## 2023-06-23 VITALS — BP 115/78 | HR 70 | Wt 125.9 lb

## 2023-06-23 DIAGNOSIS — I5032 Chronic diastolic (congestive) heart failure: Secondary | ICD-10-CM | POA: Insufficient documentation

## 2023-06-23 DIAGNOSIS — I5020 Unspecified systolic (congestive) heart failure: Secondary | ICD-10-CM | POA: Diagnosis not present

## 2023-06-23 DIAGNOSIS — I472 Ventricular tachycardia, unspecified: Secondary | ICD-10-CM | POA: Diagnosis not present

## 2023-06-23 DIAGNOSIS — R9431 Abnormal electrocardiogram [ECG] [EKG]: Secondary | ICD-10-CM
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
## 2023-06-23 NOTE — Progress Notes (Signed)
GENETIC COUNSELING NEW PATIENT EVALUATION Patient name: Erika Kelley DOB: [birth year] Age: 53 y.o. MRN: [id number]  Referring Provider/Specialty: Jeanella Craze, NP / Heart and Vascular Date of Evaluation: 06/23/2023 Chief Complaint/Reason for Referral: Arrhythmia and Cardiomyopathy   Brief Summary: Erika Kelley is a 53 y.o. female who presents today for an initial genetics evaluation for arrhythmia and cardiomyopathy. She is unaccompanied at today's visit.  Prior genetic testing has not been performed.   Family History: See pedigree obtained during today's visit under History->Family->Pedigree.  The family history was notable for the following: Son, deceased at 84.  Erika Kelley son recently passed in his sleep from an unknown cause, the family is awaiting autopsy results. Daughter, 59 yo, alive and well. Her four children, alive and well. Brother, 3 yo, alive and well. Sister, 10 yo, with an unknown thyroid condition. Sister, 25 yo, with hypertension.  Paternal Family History Father, 39 yo, with an unknown heart issue and history of strokes, 4 uncles and 6 aunts, with limited information known about them.  Maternal Family History Mother, deceased at 54 yo, from kidney disease and hypertension. Uncle, deceased at an unknown age from unknown cause. Aunt, 89 yo, alive and well.  Mother's ethnicity: African American Father's ethnicity: African American Consangunity: Denies  Prior Genetic testing: None  Genetic Counseling: JENNEY VANDENBERGE is a 53 y.o. female with a history of arrhythmia. Erika Kelley presented to the ED in November 2024 due to dizziness, diaphoresis, and vomintting.  In the ED she developed episodes of ventricular tachycardia requiring cardioversion.  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  Baseline EKGs conducted before and after this hospitalization have also shown  prolonged QT intervals.  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  It is suspected that this may have contributed to her cardiomyopathy.  Erika Kelley son also recently passed in his sleep at the age of 43.  They are awaiting autopsy results to determine a cause of death.  Given Erika Kelley's personal and family history, it is reasonable to consider genetic testing, specifically gene panel testing for arrhythmia and cardiomyopathy.  Genetic considerations were reviewed with Erika Kelley. She is aware that we have over 20,000 genes, each with an important role in the body. All of the genes are packaged into structures called chromosomes. We have two copies of every chromosome- one that is inherited from each parent- and thus two copies of every gene. Given [name]'s symptoms, concern for a genetic cause of her symptoms has arisen. If a specific genetic abnormality can be identified, it may help provide further insight into prognosis, management, and recurrence risk.  We discussed that her symptoms may be related to a genetic condition like Long QT syndrome, but there may also be environmental explanations. Her symptoms may be multifactorial in nature, meaning that genetic and environmental factors, together, cause someone to develop symptoms.  Genetic testing will allow [place] to better understand the etiology of her symptoms, management, and risk to other family members.  Erika Kelley mother passed at 36 yo from kidney disease.  We discussed that certain pathogenic variants in the APOL1 gene increases the risk of kidney disease in people who identify as Black or African American. Given her mother's history, testing  of this gene may also be included to better understand Ms.  Kelley's risk for kidney disease/  Erika Kelley is interested in pursuing this testing today. Verbal consent was provided after possible results (positive, negative, and variant of uncertain significance), and expected timeline were reviewed. A sample was collected today from Erika Kelley to be sent to [facility] for the Arrhythmia and Cardiomyopathy Comprehensive Panel (100 genes) and Single Gene APOL1 testing.  Results are expected in 2-3 weeks, at which point we will reach out with more information.   Recommendations: [facility] Arrhythmia and Cardiomyopathy Comprehensive Panel (100 genes). [facility] APOL1 single gene sequencing. Continue follow-up with other healthcare providers as recommended.  Date: 06/23/2023 Total time spent: 60 minutes. Genetic Counselor-only time: 30 minutes.  Time spent includes face to face and non-face to face care for the patient on the date of this encounter (history, genetic counseling, coordination of care, data gathering and/or documentation as outlined).   Lambert Mody MS [facility] Certified Genetic Counselor [facility] [employer]

## 2023-07-06 ENCOUNTER — Encounter: Payer: Self-pay | Admitting: Genetic Counselor

## 2023-07-11 ENCOUNTER — Telehealth: Payer: Self-pay | Admitting: Genetic Counselor

## 2023-07-11 ENCOUNTER — Ambulatory Visit (HOSPITAL_COMMUNITY)
Admission: RE | Admit: 2023-07-11 | Discharge: 2023-07-11 | Disposition: A | Discharge status: Home / Self Care | Payer: Medicaid Other | Source: Ambulatory Visit | Attending: Student | Admitting: Student

## 2023-07-11 DIAGNOSIS — I5020 Unspecified systolic (congestive) heart failure: Secondary | ICD-10-CM

## 2023-07-11 LAB — ECHOCARDIOGRAM LIMITED: S' Lateral: 2.2 cm

## 2023-07-11 NOTE — Progress Notes (Signed)
Spoke with [name] regarding the results of her recent genetic testing.  [name] was seen in the [facility] clinic due to a personal history of prolonged QT interval, cardiomyopathy, and a family history of kidney failure.  The [facility] Arrhythmia and Cardiomyopathy Comprehensive panel (100 genes) was negative/normal. At this time, we have not identified a genetic explanation for her symptoms.  No changes to medical management are recommended based on this result.  We discussed that [name] symptoms of prolonged QT interval and cardiomyopathy are likely multifactorial in nature, meaning that a combination of genetic and environmental factors, together, are causing symptoms, rather than one single genetic cause. [name] history of alcohol and drug use may be contributing to the development of her symptoms.  No testing of other family members is indicated at this time.  The [facility] APOL1 gene sequencing was negative/normal. At this time, we have not identified a genetic explanation for her family history of kidney failure.  No changes to medical management are recommended based on this result. We discussed that is still possible that her mother carried a change in her APOL1 gene that [name] did not inherit.   [name] expressed understanding of this result and was encouraged to reach out  with any further questions.  The test result has been released to her and is attached to the associated order.  Erika Franco, MS [facility] Certified Genetic Counselor

## 2023-07-14 ENCOUNTER — Other Ambulatory Visit: Payer: Self-pay | Admitting: Podiatry

## 2023-07-14 ENCOUNTER — Ambulatory Visit: Payer: Medicaid Other | Admitting: Podiatry

## 2023-07-14 ENCOUNTER — Encounter: Payer: Self-pay | Admitting: Podiatry

## 2023-07-14 ENCOUNTER — Ambulatory Visit: Payer: Medicaid Other

## 2023-07-14 DIAGNOSIS — S92504A Nondisplaced unspecified fracture of right lesser toe(s), initial encounter for closed fracture: Secondary | ICD-10-CM

## 2023-07-14 DIAGNOSIS — M7751 Other enthesopathy of right foot: Secondary | ICD-10-CM

## 2023-07-14 NOTE — Progress Notes (Signed)
 So Subjective:  Patient ID: Erika Kelley, female    DOB: 08/05/1970,  MRN: [id number] HPI Chief Complaint  Patient presents with   Foot Pain    Dorsal forefoot right - aching, discoloration, swelling x 2 months, went to Urgent Care-xrayed-said negative for fractures, gave boot- no help, still really sore and dark, swelling some better, 4th toe tender as well   New Patient (Initial Visit)    53 y.o. female presents with the above complaint.   ROS: Denies fever chills nausea vomit muscle aches pains calf pain back pain chest pain shortness of breath.  States that her foot is hurting her for 2 months denies any trauma.  Past Medical History:  Diagnosis Date   Alcohol abuse    CAD (coronary artery disease)    HFrEF (heart failure with reduced ejection fraction) (HCC)    a. EF 35-40% by echo in 04/2023   Hypertension    Ventricular tachycardia (HCC)    a. in the setting of prolonged QT and electrolyte abnormalities   Past Surgical History:  Procedure Laterality Date   CESAREAN SECTION      Current Outpatient Medications:    aspirin  EC 81 MG tablet, Take 1 tablet (81 mg total) by mouth daily. Swallow whole., Disp: 30 tablet, Rfl: 12   dapagliflozin  propanediol (FARXIGA ) 10 MG TABS tablet, Take 1 tablet (10 mg total) by mouth daily before breakfast., Disp: 90 tablet, Rfl: 3   furosemide  (LASIX ) 40 MG tablet, Take 1 tablet (40 mg total) by mouth daily., Disp: 30 tablet, Rfl: 2   magnesium  oxide (MAG-OX) 400 MG tablet, Take 1 tablet (400 mg total) by mouth daily., Disp: 90 tablet, Rfl: 3   Multiple Vitamin (MULTIVITAMIN) tablet, Take 1 tablet by mouth daily., Disp: , Rfl:    nadolol  (CORGARD ) 20 MG tablet, Take 1 tablet (20 mg total) by mouth daily., Disp: 30 tablet, Rfl: 2   nitroGLYCERIN  (NITROSTAT ) 0.4 MG SL tablet, Place 1 tablet (0.4 mg total) under the tongue every 5 (five) minutes x 3 doses as needed for chest pain. (Patient not taking: Reported on 05/16/2023), Disp: 90 tablet,  Rfl: 0   spironolactone  (ALDACTONE ) 25 MG tablet, Take 1 tablet (25 mg total) by mouth daily., Disp: 30 tablet, Rfl: 2  No Known Allergies Review of Systems Objective:  There were no vitals filed for this visit.  General: Well developed, nourished, in no acute distress, alert and oriented x3   Dermatological: Skin is warm, dry and supple bilateral. Nails x 10 are well maintained; remaining integument appears unremarkable at this time. There are no open sores, no preulcerative lesions, no rash or signs of infection present.  Vascular: Dorsalis Pedis artery and Posterior Tibial artery pedal pulses are 2/4 bilateral with immedate capillary fill time. Pedal hair growth present. No varicosities and no lower extremity edema present bilateral.   Neruologic: Grossly intact via light touch bilateral. Vibratory intact via tuning fork bilateral. Protective threshold with Semmes Wienstein monofilament intact to all pedal sites bilateral. Patellar and Achilles deep tendon reflexes 2+ bilateral. No Babinski or clonus noted bilateral.   Musculoskeletal: No gross boney pedal deformities bilateral. No pain, crepitus, or limitation noted with foot and ankle range of motion bilateral. Muscular strength 5/5 in all groups tested bilateral.  She has a postinflammatory hyperpigmentation is a mild edema around the fourth toe of the right foot exquisitely tender on palpation of the proximal phalanx of the fourth toe.  Gait: Unassisted, Nonantalgic.    Radiographs:  Radiographs taken today demonstrate an osseously mature individual with a impaction fracture of the proximal phalanx fourth digit right foot.  No other abnormalities identified.  Assessment & Plan:   Assessment: Fracture fourth toe right foot  Plan: Explained to her that with her smoking habit that it would take considerable amount of time for this to go on to heal 100%.  Demonstrated to her today how to wrap the toe with 1 inch Coban.  Also  recommended that she get back into her Darco shoe until she is completely healed.  Follow-up with her on an as-needed basis.     Fermin Yan T. [place], [place]

## 2023-09-05 ENCOUNTER — Ambulatory Visit: Payer: Commercial Managed Care - HMO | Attending: Internal Medicine | Admitting: Internal Medicine

## 2023-09-05 ENCOUNTER — Encounter: Payer: Self-pay | Admitting: Internal Medicine

## 2023-09-05 VITALS — BP 128/78 | HR 90 | Ht 66.0 in | Wt 135.0 lb

## 2023-09-05 DIAGNOSIS — Z8674 Personal history of sudden cardiac arrest: Secondary | ICD-10-CM | POA: Diagnosis not present

## 2023-09-05 DIAGNOSIS — Z720 Tobacco use: Secondary | ICD-10-CM

## 2023-09-05 DIAGNOSIS — I4581 Long QT syndrome: Secondary | ICD-10-CM

## 2023-09-05 NOTE — Patient Instructions (Addendum)
 Medication Instructions:  Your physician recommends that you continue on your current medications as directed. Please refer to the Current Medication list given to you today.  *If you need a refill on your cardiac medications before your next appointment, please call your pharmacy*  Lab Work: None If you have labs (blood work) drawn today and your tests are completely normal, you will receive your results only by: MyChart Message (if you have MyChart) OR A paper copy in the mail If you have any lab test that is abnormal or we need to change your treatment, we will call you to review the results.  Testing/Procedures: None  Follow-Up: At [facility], you and your health needs are our priority.  As part of our continuing mission to provide you with exceptional heart care, our providers are all part of one team.  This team includes your primary Cardiologist (physician) and Advanced Practice Providers or APPs (Physician Assistants and Nurse Practitioners) who all work together to provide you with the care you need, when you need it.  Your next appointment:   1 year(s)  Provider:   You may see Vishnu P Mallipeddi, MD or one of the following Advanced Practice Providers on your designated Care Team:   [place], PA-C  [place], [place] Jacolyn Reedy, [place]     We recommend signing up for the patient portal called "MyChart".  Sign up information is provided on this After Visit Summary.  MyChart is used to connect with patients for Virtual Visits (Telemedicine).  Patients are able to view lab/test results, encounter notes, upcoming appointments, etc.  Non-urgent messages can be sent to your provider as well.   To learn more about what you can do with MyChart, go to [email or web address].   Other Instructions

## 2023-09-05 NOTE — Progress Notes (Signed)
 Cardiology Office Note  Date: 09/05/2023   ID: Erika Kelley, DOB [birth year], MRN [id number]  PCP:  [facility]  Cardiologist:  Marjo Bicker, MD Electrophysiologist:  None   History of Present Illness: Erika Kelley is a 53 y.o. 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  Patient presented to [facility] ER with PMVT with baseline prolonged QT interval consistent with Torsades de pointes.  She had multiple electrolyte abnormalities at that time.  Lidocaine was administered and was transferred to [facility] for further evaluation.  Lidocaine was eventually weaned off and nadolol was started.  She had a family history of her son passing away in sleep in his 30s.  She underwent genetic testing that came out to be normal.  Her echocardiogram at the time of torsades was noted to be moderately reduced, improved to 60 to 65% in Feb 2025.  Currently on GDMT.  Tolerating very well.  She quit alcohol.  Currently smoking cigarettes but cut back significantly.  She is motivated  to quit.  Overall doing great, no symptoms of angina, DOE, dizziness, palpitations, leg swelling or syncope.  Past Medical History:  Diagnosis Date   Alcohol abuse    CAD (coronary artery disease)    HFrEF (heart failure with reduced ejection fraction) (HCC)    a. EF 35-40% by echo in 04/2023   Hypertension    Ventricular tachycardia (HCC)    a. in the setting of prolonged QT and electrolyte abnormalities    Past Surgical History:  Procedure Laterality Date   CESAREAN SECTION      Current Outpatient Medications  Medication Sig Dispense Refill   aspirin EC 81 MG tablet Take 1 tablet (81 mg total) by mouth daily. Swallow whole. 30 tablet 12   dapagliflozin propanediol (FARXIGA) 10 MG TABS tablet Take 1 tablet (10 mg total) by mouth daily  before breakfast. 90 tablet 3   furosemide (LASIX) 40 MG tablet Take 1 tablet (40 mg total) by mouth daily. 30 tablet 2   magnesium oxide (MAG-OX) 400 MG tablet Take 1 tablet (400 mg total) by mouth daily. 90 tablet 3   Multiple Vitamin (MULTIVITAMIN) tablet Take 1 tablet by mouth daily.     nadolol (CORGARD) 20 MG tablet Take 1 tablet (20 mg total) by mouth daily. 30 tablet 2   nitroGLYCERIN (NITROSTAT) 0.4 MG SL tablet Place 1 tablet (0.4 mg total) under the tongue every 5 (five) minutes x 3 doses as needed for chest pain. 90 tablet 0   spironolactone (ALDACTONE) 25 MG tablet Take 1 tablet (25 mg total) by mouth daily. 30 tablet 2   No current facility-administered medications for this visit.   Allergies:  Patient has no known allergies.   Social History: The patient  reports that she has been smoking cigarettes. She has never used smokeless tobacco. She reports current alcohol use. She reports current drug use. Drug: Marijuana.   Family History: The patient's family history includes Heart disease in her father; Hypertension in her mother and sister; Kidney failure in her mother; Stroke in her father and mother; Sudden Cardiac Death in her son; Thyroid disease in her sister.   ROS:  Please see the history of present illness. Otherwise, complete review of systems is positive for none.  All other systems are reviewed and negative.  Physical Exam: VS:  BP 128/78 (BP Location: Right Arm, Patient Position: Sitting, Cuff Size: Normal)   Pulse 90   Ht 5\' 6"  (1.676 m)   Wt 135 lb (61.2 kg)   LMP 05/25/2015   SpO2 94%   BMI 21.79 kg/m , BMI Body mass index is 21.79 kg/m.  Wt Readings from Last 3 Encounters:  09/05/23 135 lb (61.2 kg)  06/23/23 125 lb 14.4 oz (57.1 kg)  05/16/23 126 lb 12.8 oz (57.5 kg)    General: Patient appears comfortable at rest. HEENT: Conjunctiva and lids normal, oropharynx clear with moist mucosa. Neck: Supple, no elevated JVP or carotid bruits, no  thyromegaly. Lungs: Clear to auscultation, nonlabored breathing at rest. Cardiac: Regular rate and rhythm, no S3 or significant systolic murmur, no pericardial rub. Abdomen: Soft, nontender, no hepatomegaly, bowel sounds present, no guarding or rebound. Extremities: No pitting edema, distal pulses 2+. Skin: Warm and dry. Musculoskeletal: No kyphosis. Neuropsychiatric: Alert and oriented x3, affect grossly appropriate.  Recent Labwork: 04/29/2023: B Natriuretic Peptide 488.8 05/01/2023: Hemoglobin 11.7; Platelets 86 05/10/2023: ALT 68; AST 74; BUN 11; Creatinine, Ser 0.58; Magnesium 1.6; Potassium 3.9; Sodium 139     Component Value Date/Time   CHOL 132 04/30/2023 0249   TRIG 51 04/30/2023 0249   HDL 75 04/30/2023 0249   CHOLHDL 1.8 04/30/2023 0249   VLDL 10 04/30/2023 0249   LDLCALC 47 04/30/2023 0249     Assessment and Plan:  Cardiac arrest from torsades de points in November 2024 secondary to severe electrolyte abnormalities: No interval events.  She quit alcohol. EKG from December 2024 showed NSR, QTc 488 ms.  Genetic testing was sent out due to baseline prolonged QTc interval, came out to be normal.  No evidence of long QT syndrome.  Continue nadolol 20 mg once daily.  HFimpEF (355 in 04/2023 improved to 60-65% in 07/2023): No interval symptoms.  Continue current GDMT, p.o. Lasix 40 mg once daily, Farxiga 10 mg once daily, nadolol 20 mg once daily, spironolactone 25 mg once daily.  Nicotine abuse: Currently smokes 2 cigarettes twice a day.  Cut back significantly, congratulated.  Motivated to quit.  Counseling provided.    Medication Adjustments/Labs and Tests Ordered: Current medicines are reviewed at length with the patient today.  Concerns regarding medicines are outlined above.    Disposition:  Follow up 1 year  Signed, Janiaya Ryser Verne Spurr, MD, 09/05/2023 9:42 AM    [facility] [address] [place], [place] [address]

## 2023-10-11 ENCOUNTER — Encounter (HOSPITAL_COMMUNITY): Payer: Self-pay

## 2023-10-11 ENCOUNTER — Observation Stay (HOSPITAL_COMMUNITY)
Admission: EM | Admit: 2023-10-11 | Discharge: 2023-10-12 | Disposition: A | Discharge status: Home / Self Care | Attending: Family Medicine | Admitting: Family Medicine

## 2023-10-11 ENCOUNTER — Emergency Department (HOSPITAL_COMMUNITY)

## 2023-10-11 ENCOUNTER — Other Ambulatory Visit: Payer: Self-pay

## 2023-10-11 DIAGNOSIS — R000 Tachycardia, unspecified: Secondary | ICD-10-CM | POA: Diagnosis not present

## 2023-10-11 DIAGNOSIS — I10 Essential (primary) hypertension: Secondary | ICD-10-CM | POA: Diagnosis not present

## 2023-10-11 DIAGNOSIS — I110 Hypertensive heart disease with heart failure: Secondary | ICD-10-CM | POA: Insufficient documentation

## 2023-10-11 DIAGNOSIS — R1013 Epigastric pain: Secondary | ICD-10-CM | POA: Diagnosis not present

## 2023-10-11 DIAGNOSIS — I2510 Atherosclerotic heart disease of native coronary artery without angina pectoris: Secondary | ICD-10-CM | POA: Diagnosis not present

## 2023-10-11 DIAGNOSIS — Z79899 Other long term (current) drug therapy: Secondary | ICD-10-CM | POA: Insufficient documentation

## 2023-10-11 DIAGNOSIS — R109 Unspecified abdominal pain: Secondary | ICD-10-CM | POA: Diagnosis not present

## 2023-10-11 DIAGNOSIS — I5032 Chronic diastolic (congestive) heart failure: Secondary | ICD-10-CM

## 2023-10-11 DIAGNOSIS — Z7982 Long term (current) use of aspirin: Secondary | ICD-10-CM | POA: Diagnosis not present

## 2023-10-11 DIAGNOSIS — E878 Other disorders of electrolyte and fluid balance, not elsewhere classified: Secondary | ICD-10-CM | POA: Diagnosis not present

## 2023-10-11 DIAGNOSIS — K859 Acute pancreatitis without necrosis or infection, unspecified: Principal | ICD-10-CM

## 2023-10-11 LAB — CBC
HCT: 44.9 % (ref 36.0–46.0)
Hemoglobin: 14.7 g/dL (ref 12.0–15.0)
MCH: 27.5 pg (ref 26.0–34.0)
MCHC: 32.7 g/dL (ref 30.0–36.0)
MCV: 83.9 fL (ref 80.0–100.0)
Platelets: 213 10*3/uL (ref 150–400)
RBC: 5.35 MIL/uL — ABNORMAL HIGH (ref 3.87–5.11)
RDW: 13 % (ref 11.5–15.5)
WBC: 6 10*3/uL (ref 4.0–10.5)
nRBC: 0 % (ref 0.0–0.2)

## 2023-10-11 LAB — URINALYSIS, ROUTINE W REFLEX MICROSCOPIC
Bacteria, UA: NONE SEEN
Bilirubin Urine: NEGATIVE
Glucose, UA: 150 mg/dL — AB
Hgb urine dipstick: NEGATIVE
Ketones, ur: NEGATIVE mg/dL
Leukocytes,Ua: NEGATIVE
Nitrite: NEGATIVE
Protein, ur: 30 mg/dL — AB
Specific Gravity, Urine: 1.023 (ref 1.005–1.030)
pH: 5 (ref 5.0–8.0)

## 2023-10-11 LAB — COMPREHENSIVE METABOLIC PANEL WITH GFR
ALT: 35 U/L (ref 0–44)
AST: 35 U/L (ref 15–41)
Albumin: 3.9 g/dL (ref 3.5–5.0)
Alkaline Phosphatase: 77 U/L (ref 38–126)
Anion gap: 12 (ref 5–15)
BUN: <5 mg/dL — ABNORMAL LOW (ref 6–20)
CO2: 27 mmol/L (ref 22–32)
Calcium: 9.2 mg/dL (ref 8.9–10.3)
Chloride: 93 mmol/L — ABNORMAL LOW (ref 98–111)
Creatinine, Ser: 0.49 mg/dL (ref 0.44–1.00)
GFR, Estimated: >60 mL/min (ref >60)
Glucose, Bld: 138 mg/dL — ABNORMAL HIGH (ref 70–99)
Potassium: 2.8 mmol/L — ABNORMAL LOW (ref 3.5–5.1)
Sodium: 132 mmol/L — ABNORMAL LOW (ref 135–145)
Total Bilirubin: 0.8 mg/dL (ref 0.0–1.2)
Total Protein: 8.1 g/dL (ref 6.5–8.1)

## 2023-10-11 LAB — LIPASE, BLOOD: Lipase: 170 U/L — ABNORMAL HIGH (ref 11–51)

## 2023-10-11 LAB — MAGNESIUM: Magnesium: 1.7 mg/dL (ref 1.7–2.4)

## 2023-10-11 MED ORDER — MORPHINE SULFATE (PF) 4 MG/ML IV SOLN
4.0000 mg | Freq: Once | INTRAVENOUS | Status: AC
  Administered 2023-10-11: 4 mg via INTRAVENOUS
  Filled 2023-10-11: qty 1

## 2023-10-11 MED ORDER — MAGNESIUM OXIDE -MG SUPPLEMENT 400 (240 MG) MG PO TABS
400.0000 mg | ORAL_TABLET | Freq: Every day | ORAL | Status: DC
Start: 2023-10-12 — End: 2023-10-12
  Administered 2023-10-12: 400 mg via ORAL
  Filled 2023-10-11: qty 1

## 2023-10-11 MED ORDER — HYDROMORPHONE HCL 1 MG/ML IJ SOLN
0.5000 mg | INTRAMUSCULAR | Status: DC | PRN
  Administered 2023-10-11 – 2023-10-12 (×2): 1 mg via INTRAVENOUS
  Filled 2023-10-11 (×2): qty 1

## 2023-10-11 MED ORDER — SPIRONOLACTONE 25 MG PO TABS
25.0000 mg | ORAL_TABLET | Freq: Every day | ORAL | Status: DC
  Administered 2023-10-12: 25 mg via ORAL
  Filled 2023-10-11: qty 1

## 2023-10-11 MED ORDER — DAPAGLIFLOZIN PROPANEDIOL 10 MG PO TABS
10.0000 mg | ORAL_TABLET | Freq: Every day | ORAL | Status: DC
  Administered 2023-10-12: 10 mg via ORAL
  Filled 2023-10-11: qty 1

## 2023-10-11 MED ORDER — IOHEXOL 300 MG/ML  SOLN
100.0000 mL | Freq: Once | INTRAMUSCULAR | Status: AC | PRN
  Administered 2023-10-11: 100 mL via INTRAVENOUS

## 2023-10-11 MED ORDER — PANTOPRAZOLE SODIUM 40 MG IV SOLR
40.0000 mg | Freq: Once | INTRAVENOUS | Status: AC
  Administered 2023-10-11: 40 mg via INTRAVENOUS
  Filled 2023-10-11: qty 10

## 2023-10-11 MED ORDER — NADOLOL 20 MG PO TABS
20.0000 mg | ORAL_TABLET | Freq: Every day | ORAL | Status: DC
  Filled 2023-10-11 (×2): qty 1

## 2023-10-11 MED ORDER — LACTATED RINGERS IV SOLN: INTRAVENOUS | Status: DC

## 2023-10-11 MED ORDER — ENOXAPARIN SODIUM 40 MG/0.4ML IJ SOSY
40.0000 mg | PREFILLED_SYRINGE | INTRAMUSCULAR | Status: DC
Start: 2023-10-12 — End: 2023-10-12
  Administered 2023-10-12: 40 mg via SUBCUTANEOUS
  Filled 2023-10-11: qty 0.4

## 2023-10-11 MED ORDER — SACUBITRIL-VALSARTAN 24-26 MG PO TABS
1.0000 | ORAL_TABLET | Freq: Two times a day (BID) | ORAL | Status: DC
  Administered 2023-10-12: 1 via ORAL
  Filled 2023-10-11: qty 1

## 2023-10-11 MED ORDER — SODIUM CHLORIDE 0.9 % IV BOLUS
500.0000 mL | Freq: Once | INTRAVENOUS | Status: AC
  Administered 2023-10-11: 500 mL via INTRAVENOUS

## 2023-10-11 MED ORDER — POTASSIUM CHLORIDE 10 MEQ/100ML IV SOLN
10.0000 meq | INTRAVENOUS | Status: AC
  Administered 2023-10-11 (×2): 10 meq via INTRAVENOUS
  Filled 2023-10-11 (×2): qty 100

## 2023-10-11 MED ORDER — POTASSIUM CHLORIDE CRYS ER 20 MEQ PO TBCR
40.0000 meq | EXTENDED_RELEASE_TABLET | Freq: Once | ORAL | Status: AC
  Administered 2023-10-11: 40 meq via ORAL
  Filled 2023-10-11: qty 2

## 2023-10-11 MED ORDER — ASPIRIN 81 MG PO TBEC
81.0000 mg | DELAYED_RELEASE_TABLET | Freq: Every day | ORAL | Status: DC
  Administered 2023-10-12: 81 mg via ORAL
  Filled 2023-10-11: qty 1

## 2023-10-11 NOTE — ED Notes (Signed)
 ED TO INPATIENT HANDOFF REPORT  ED Nurse Name and Phone #:   S Name/Age/Gender Erika Kelley 53 y.o. female Room/Bed: APA18/APA18  Code Status   Code Status: Prior  Home/SNF/Other Home Patient oriented to: self, place, time, and situation Is this baseline? Yes   Triage Complete: Triage complete  Chief Complaint Pancreatitis [K85.90]  Triage Note Pt arrived via POV c/o generalized abdominal pain since yesterday. Pt reports she has not had a BM in a few days. Pt reports taking OTC Stool softener and drinking coffee w/o relief.    Allergies No Known Allergies  Level of Care/Admitting Diagnosis ED Disposition     ED Disposition  Admit   Condition  --   Comment  Hospital Area: [facility] [[id number]]  Level of Care: Med-Surg [16]  Covid Evaluation: Asymptomatic - no recent exposure (last 10 days) testing not required  Diagnosis: Pancreatitis [161096]  Admitting Physician: Otilia Bloch [[id number]]  Attending Physician: Ambrose Junk, DAVID J [phone]  Certification:: I certify there are rare and unusual circumstances requiring inpatient admission  Expected Medical Readiness: 10/21/2023          B Medical/Surgery History Past Medical History:  Diagnosis Date   Alcohol abuse    CAD (coronary artery disease)    HFrEF (heart failure with reduced ejection fraction) (HCC)    a. EF 35-40% by echo in 04/2023   Hypertension    Ventricular tachycardia (HCC)    a. in the setting of prolonged QT and electrolyte abnormalities   Past Surgical History:  Procedure Laterality Date   CESAREAN SECTION       A IV Location/Drains/Wounds Patient Lines/Drains/Airways Status     Active Line/Drains/Airways     Name Placement date Placement time Site Days   Peripheral IV 10/11/23 20 G Left;Posterior;Distal Forearm 10/11/23  1628  Forearm  less than 1            Intake/Output Last 24 hours No intake or output data in the 24 hours ending 10/11/23  1846  Labs/Imaging Results for orders placed or performed during the hospital encounter of 10/11/23 (from the past 48 hours)  Urinalysis, Routine w reflex microscopic -Urine, Clean Catch     Status: Abnormal   Collection Time: 10/11/23  3:30 PM  Result Value Ref Range   Color, Urine AMBER (A) YELLOW    Comment: BIOCHEMICALS MAY BE AFFECTED BY COLOR   APPearance CLEAR CLEAR   Specific Gravity, Urine 1.023 1.005 - 1.030   pH 5.0 5.0 - 8.0   Glucose, UA 150 (A) NEGATIVE mg/dL   Hgb urine dipstick NEGATIVE NEGATIVE   Bilirubin Urine NEGATIVE NEGATIVE   Ketones, ur NEGATIVE NEGATIVE mg/dL   Protein, ur 30 (A) NEGATIVE mg/dL   Nitrite NEGATIVE NEGATIVE   Leukocytes,Ua NEGATIVE NEGATIVE   RBC / HPF 0-5 0 - 5 RBC/hpf   WBC, UA 0-5 0 - 5 WBC/hpf   Bacteria, UA NONE SEEN NONE SEEN   Squamous Epithelial / HPF 0-5 0 - 5 /HPF   Mucus PRESENT     Comment: Performed at [facility], [address] [place], [place] [address]  Lipase, blood     Status: Abnormal   Collection Time: 10/11/23  3:40 PM  Result Value Ref Range   Lipase 170 (H) 11 - 51 U/L    Comment: Performed at [facility], [address] [place], [place] [address]  Comprehensive metabolic panel     Status: Abnormal   Collection Time: 10/11/23  3:40 PM  Result Value Ref Range   Sodium 132 (L) 135 - 145 mmol/L   Potassium 2.8 (L) 3.5 - 5.1 mmol/L   Chloride 93 (L) 98 - 111 mmol/L   CO2 27 22 - 32 mmol/L   Glucose, Bld 138 (H) 70 - 99 mg/dL    Comment: Glucose reference range applies only to samples taken after fasting for at least 8 hours.   BUN <5 (L) 6 - 20 mg/dL   Creatinine, Ser 6.60 0.44 - 1.00 mg/dL   Calcium  9.2 8.9 - 10.3 mg/dL   Total Protein 8.1 6.5 - 8.1 g/dL   Albumin 3.9 3.5 - 5.0 g/dL   AST 35 15 - 41 U/L   ALT 35 0 - 44 U/L   Alkaline Phosphatase 77 38 - 126 U/L   Total Bilirubin 0.8 0.0 - 1.2 mg/dL   GFR, Estimated >63 >01 mL/min    Comment: (NOTE) Calculated using the CKD-EPI Creatinine Equation (2021)     Anion gap 12 5 - 15    Comment: Performed at [facility], [address], [place], [place] [address]  CBC     Status: Abnormal   Collection Time: 10/11/23  3:40 PM  Result Value Ref Range   WBC 6.0 4.0 - 10.5 K/uL   RBC 5.35 (H) 3.87 - 5.11 MIL/uL   Hemoglobin 14.7 12.0 - 15.0 g/dL   HCT 32.3 55.7 - 32.2 %   MCV 83.9 80.0 - 100.0 fL   MCH 27.5 26.0 - 34.0 pg   MCHC 32.7 30.0 - 36.0 g/dL   RDW 02.5 42.7 - 06.2 %   Platelets 213 150 - 400 K/uL   nRBC 0.0 0.0 - 0.2 %    Comment: Performed at [facility], [address], [place], [place] [address]  Magnesium      Status: None   Collection Time: 10/11/23  3:40 PM  Result Value Ref Range   Magnesium  1.7 1.7 - 2.4 mg/dL    Comment: Performed at [facility], [address], [place], [place] [address]   CT ABDOMEN PELVIS W CONTRAST Result Date: 10/11/2023 CLINICAL DATA:  Abdominal pain EXAM: CT ABDOMEN AND PELVIS WITH CONTRAST TECHNIQUE: Multidetector CT imaging of the abdomen and pelvis was performed using the standard protocol following bolus administration of intravenous contrast. RADIATION DOSE REDUCTION: This exam was performed according to the departmental dose-optimization program which includes automated exposure control, adjustment of the mA and/or kV according to patient size and/or use of iterative reconstruction technique. CONTRAST:  OMNIPAQUE  IOHEXOL  300 MG/ML  SOLN COMPARISON:  None Available. FINDINGS: Lower chest: No acute findings. Hepatobiliary: Diffuse low-density throughout the liver compatible with fatty infiltration. No focal abnormality. Gallbladder unremarkable. Pancreas: No focal abnormality or ductal dilatation. Fluid noted adjacent 2 the pancreas. Recommend clinical correlation to exclude acute pancreatitis. Spleen: No focal abnormality.  Normal size. Adrenals/Urinary Tract: No adrenal abnormality. No focal renal abnormality. No stones or hydronephrosis. Urinary bladder is unremarkable. Stomach/Bowel: Stomach, large  and small bowel grossly unremarkable. Normal appendix. Vascular/Lymphatic: Aortic atherosclerosis. No evidence of aneurysm or adenopathy. Reproductive: Uterus and adnexa unremarkable.  No mass. Other: No free fluid or free air. Musculoskeletal: No acute bony abnormality. IMPRESSION: Hepatic steatosis. Slight fluid adjacent to the pancreas raises the possibility of acute pancreatitis. Recommend correlation with laboratory values. Aortic atherosclerosis. Electronically Signed   By: Janeece Mechanic M.D.   On: 10/11/2023 17:30    Pending Labs Unresulted Labs (From admission, onward)    None  Vitals/Pain Today's Vitals   10/11/23 1752 10/11/23 1800 10/11/23 1815 10/11/23 1827  BP:  114/74 123/84   Pulse:  64 67   Resp:  (!) 9 19   Temp:      TempSrc:      SpO2:  91% 91%   Weight:      Height:      PainSc: 8    8     Isolation Precautions No active isolations  Medications Medications  potassium chloride  10 mEq in 100 mL IVPB (10 mEq Intravenous New Bag/Given 10/11/23 1753)  potassium chloride  SA (KLOR-CON  M) CR tablet 40 mEq (40 mEq Oral Given 10/11/23 1643)  morphine  (PF) 4 MG/ML injection 4 mg (4 mg Intravenous Given 10/11/23 1705)  pantoprazole  (PROTONIX ) injection 40 mg (40 mg Intravenous Given 10/11/23 1705)  iohexol  (OMNIPAQUE ) 300 MG/ML solution 100 mL (100 mLs Intravenous Contrast Given 10/11/23 1717)  sodium chloride  0.9 % bolus 500 mL (500 mLs Intravenous Bolus from Bag 10/11/23 1753)  morphine  (PF) 4 MG/ML injection 4 mg (4 mg Intravenous Given 10/11/23 1827)    Mobility walks     Focused Assessments    R Recommendations: See Admitting Provider Note  Report given to:   Additional Notes:

## 2023-10-11 NOTE — H&P (Signed)
 History and Physical    Erika Kelley WUJ:[id number] DOB: [birth year] DOA: 10/11/2023  PCP: [facility] Patient coming from: home  Chief Complaint: Abd pain  HPI: Erika Kelley is a 53 y.o. female with medical history significant of CAD, SCHF, HTN, Vtach w/ electrolyte abnormality.  Pt presenting w/ rapid onset ABD pain. Started day prior to admission. Small amount of ETOH day prior. Epigastric w/ associated nausea. No emesis. Complete loss of appetite dueing this time. Has h/o prior pancreatitis and states sx feel the same.     ED Course: Workup as below showing pancreatitis on imaging and labs. Pain mgt and fluids given.   Review of Systems: As per HPI otherwise all other systems reviewed and are negative  Ambulatory Status:no restricitons.   Past Medical History:  Diagnosis Date   Alcohol abuse    CAD (coronary artery disease)    HFrEF (heart failure with reduced ejection fraction) (HCC)    a. EF 35-40% by echo in 04/2023   Hypertension    Ventricular tachycardia (HCC)    a. in the setting of prolonged QT and electrolyte abnormalities    Past Surgical History:  Procedure Laterality Date   CESAREAN SECTION      Social History   Socioeconomic History   Marital status: Single    Spouse name: Not on file   Number of children: 2   Years of education: Not on file   Highest education level: 10th grade  Occupational History   Occupation: Not working  Tobacco Use   Smoking status: Every Day    Types: Cigarettes   Smokeless tobacco: Never  Vaping Use   Vaping status: Never Used  Substance and Sexual Activity   Alcohol use: Yes    Comment: Vodka daily ( quitting)   Drug use: Yes    Types: Marijuana    Comment: 1 x per month   Sexual activity: Not on file  Other Topics Concern   Not on file  Social History Narrative   Not on file   Social Drivers of Health   Financial Resource Strain: Medium Risk (05/02/2023)   Overall Financial  Resource Strain (CARDIA)    Difficulty of Paying Living Expenses: Somewhat hard  Food Insecurity: No Food Insecurity (10/11/2023)   Hunger Vital Sign    Worried About Running Out of Food in the Last Year: Never true    Ran Out of Food in the Last Year: Never true  Transportation Needs: No Transportation Needs (10/11/2023)   PRAPARE - Administrator, Civil Service (Medical): No    Lack of Transportation (Non-Medical): No  Physical Activity: Not on file  Stress: Not on file  Social Connections: Not on file  Intimate Partner Violence: Not At Risk (10/11/2023)   Humiliation, Afraid, Rape, and Kick questionnaire    Fear of Current or Ex-Partner: No    Emotionally Abused: No    Physically Abused: No    Sexually Abused: No    No Known Allergies  Family History  Problem Relation Age of Onset   Hypertension Mother    Kidney failure Mother    Stroke Mother    Sudden Cardiac Death Son        awaiting autopsy results   Stroke Father    Heart disease Father    Hypertension Sister    Thyroid disease Sister       Prior to Admission medications   Medication Sig Start Date End Date Taking? Authorizing Provider  aspirin  EC 81 MG tablet Take 1 tablet (81 mg total) by mouth daily. Swallow whole. 05/04/23  Yes Magdalene School, MD  dapagliflozin  propanediol (FARXIGA ) 10 MG TABS tablet Take 1 tablet (10 mg total) by mouth daily before breakfast. 05/16/23  Yes Arleene Belt, PA-C  ENTRESTO  24-26 MG Take 1 tablet by mouth 2 (two) times daily. 09/29/23  Yes [provider]  furosemide  (LASIX ) 40 MG tablet Take 1 tablet (40 mg total) by mouth daily. 05/16/23 10/11/23 Yes Arleene Belt, PA-C  MAGNESIUM -OXIDE 400 (240 Mg) MG tablet Take 1 tablet by mouth daily. 09/29/23  Yes [provider]  Multiple Vitamin (MULTIVITAMIN) tablet Take 1 tablet by mouth daily.   Yes [provider]  nadolol  (CORGARD ) 20 MG tablet Take 1 tablet (20 mg total) by mouth daily.  05/16/23 10/11/23 Yes Arleene Belt, PA-C  nitroGLYCERIN  (NITROSTAT ) 0.4 MG SL tablet Place 1 tablet (0.4 mg total) under the tongue every 5 (five) minutes x 3 doses as needed for chest pain. 05/03/23 10/11/23 Yes Magdalene School, MD  spironolactone  (ALDACTONE ) 25 MG tablet Take 1 tablet (25 mg total) by mouth daily. 05/16/23 10/11/23 Yes Arleene Belt, PA-C    Physical Exam: Vitals:   10/11/23 1800 10/11/23 1815 10/11/23 1918 10/11/23 1947  BP: 114/74 123/84 109/70 (!) 139/103  Pulse: 64 67 66 67  Resp: (!) 9 19 16 18   Temp:   98.4 F (36.9 C) 98 F (36.7 C)  TempSrc:   Oral Oral  SpO2: 91% 91% 91% 99%  Weight:    64.5 kg  Height:         General:  Appears calm and comfortable Eyes:  PERRL, EOMI, normal lids, iris ENT:  grossly normal hearing, lips & tongue, mmm Neck:  no LAD, masses or thyromegaly Cardiovascular:  RRR, no m/r/g. No LE edema.  Respiratory:  CTA bilaterally, no w/r/r. Normal respiratory effort. Abdomen: Hyperactive Bs. Midline ttp. Soft.  Skin:  no rash or induration seen on limited exam Musculoskeletal:  grossly normal tone BUE/BLE, good ROM, no bony abnormality Psychiatric:  grossly normal mood and affect, speech fluent and appropriate, AOx3 Neurologic:  CN 2-12 grossly intact, moves all extremities in coordinated fashion, sensation intact  Labs on Admission: I have personally reviewed following labs and imaging studies  CBC: Recent Labs  Lab 10/11/23 1540  WBC 6.0  HGB 14.7  HCT 44.9  MCV 83.9  PLT 213   Basic Metabolic Panel: Recent Labs  Lab 10/11/23 1540  NA 132*  K 2.8*  CL 93*  CO2 27  GLUCOSE 138*  BUN <5*  CREATININE 0.49  CALCIUM  9.2  MG 1.7   GFR: Estimated Creatinine Clearance: 78.8 mL/min (by C-G formula based on SCr of 0.49 mg/dL). Liver Function Tests: Recent Labs  Lab 10/11/23 1540  AST 35  ALT 35  ALKPHOS 77  BILITOT 0.8  PROT 8.1  ALBUMIN 3.9   Recent Labs  Lab 10/11/23 1540  LIPASE 170*   No  results for input(s): "AMMONIA" in the last 168 hours. Coagulation Profile: No results for input(s): "INR", "PROTIME" in the last 168 hours. Cardiac Enzymes: No results for input(s): "CKTOTAL", "CKMB", "CKMBINDEX", "TROPONINI" in the last 168 hours. BNP (last 3 results) No results for input(s): "PROBNP" in the last 8760 hours. HbA1C: No results for input(s): "HGBA1C" in the last 72 hours. CBG: No results for input(s): "GLUCAP" in the last 168 hours. Lipid Profile: No results for input(s): "CHOL", "HDL", "LDLCALC", "TRIG", "CHOLHDL", "LDLDIRECT"  in the last 72 hours. Thyroid Function Tests: No results for input(s): "TSH", "T4TOTAL", "FREET4", "T3FREE", "THYROIDAB" in the last 72 hours. Anemia Panel: No results for input(s): "VITAMINB12", "FOLATE", "FERRITIN", "TIBC", "IRON", "RETICCTPCT" in the last 72 hours. Urine analysis:    Component Value Date/Time   COLORURINE AMBER (A) 10/11/2023 1530   APPEARANCEUR CLEAR 10/11/2023 1530   LABSPEC 1.023 10/11/2023 1530   PHURINE 5.0 10/11/2023 1530   GLUCOSEU 150 (A) 10/11/2023 1530   HGBUR NEGATIVE 10/11/2023 1530   BILIRUBINUR NEGATIVE 10/11/2023 1530   KETONESUR NEGATIVE 10/11/2023 1530   PROTEINUR 30 (A) 10/11/2023 1530   UROBILINOGEN 0.2 03/30/2012 2012   NITRITE NEGATIVE 10/11/2023 1530   LEUKOCYTESUR NEGATIVE 10/11/2023 1530    Creatinine Clearance: Estimated Creatinine Clearance: 78.8 mL/min (by C-G formula based on SCr of 0.49 mg/dL).  Sepsis Labs: @LABRCNTIP (procalcitonin:4,lacticidven:4) )No results found for this or any previous visit (from the past 240 hours).   Radiological Exams on Admission: CT ABDOMEN PELVIS W CONTRAST Result Date: 10/11/2023 CLINICAL DATA:  Abdominal pain EXAM: CT ABDOMEN AND PELVIS WITH CONTRAST TECHNIQUE: Multidetector CT imaging of the abdomen and pelvis was performed using the standard protocol following bolus administration of intravenous contrast. RADIATION DOSE REDUCTION: This exam was  performed according to the departmental dose-optimization program which includes automated exposure control, adjustment of the mA and/or kV according to patient size and/or use of iterative reconstruction technique. CONTRAST:  OMNIPAQUE  IOHEXOL  300 MG/ML  SOLN COMPARISON:  None Available. FINDINGS: Lower chest: No acute findings. Hepatobiliary: Diffuse low-density throughout the liver compatible with fatty infiltration. No focal abnormality. Gallbladder unremarkable. Pancreas: No focal abnormality or ductal dilatation. Fluid noted adjacent 2 the pancreas. Recommend clinical correlation to exclude acute pancreatitis. Spleen: No focal abnormality.  Normal size. Adrenals/Urinary Tract: No adrenal abnormality. No focal renal abnormality. No stones or hydronephrosis. Urinary bladder is unremarkable. Stomach/Bowel: Stomach, large and small bowel grossly unremarkable. Normal appendix. Vascular/Lymphatic: Aortic atherosclerosis. No evidence of aneurysm or adenopathy. Reproductive: Uterus and adnexa unremarkable.  No mass. Other: No free fluid or free air. Musculoskeletal: No acute bony abnormality. IMPRESSION: Hepatic steatosis. Slight fluid adjacent to the pancreas raises the possibility of acute pancreatitis. Recommend correlation with laboratory values. Aortic atherosclerosis. Electronically Signed   By: Janeece Mechanic M.D.   On: 10/11/2023 17:30    EKG: Independently reviewed. NSR, No ACS. Prologed QT.   Assessment/Plan Principal Problem:   Pancreatitis Active Problems:   Primary hypertension   Heart failure with improved ejection fraction (HFimpEF) (HCC)   Electrolyte abnormality   Pancreatitis: Suspect secondary to ETOH intake. Lipase 170. CT w/o cholelithiasis.  - NPO - sips w/ meds.  - LR overnight x 12 hrs.  - IV Pain mgt - FLP in am for triglycerides eval.   Electrolyte abnormality: secondary to poor po. NA 132. K 2.8, Cl 93. NS bolus and K given in ED.  - IVF as above - Bmp in AM  sCHF:  compensated. EF 35%.  - Continue home entresto , Farxiga , Spironolactone  - Hold Lasix  dose on 10/12/23    DVT prophylaxis: lovenox   Code Status: full  Family Communication: none  Disposition Plan: pending improvement in pancreatitis  Consults called: none  Admission status: inpt    Otilia Bloch MD [facility]  If 7PM-7AM, please contact night-coverage [email or web address] Password [email or web address]  10/11/2023, 10:35 PM

## 2023-10-11 NOTE — Progress Notes (Signed)
   10/11/23 2120  TOC Brief Assessment  Insurance and Status Reviewed  Patient has primary care physician Yes  Home environment has been reviewed From home  Prior level of function: Independent  Prior/Current Home Services No current home services  Social Drivers of Health Review SDOH reviewed interventions complete (smoking cessation added to AVS)  Readmission risk has been reviewed Yes  Transition of care needs no transition of care needs at this time   Transition of Care Department [facility]) has reviewed patient and no other TOC needs have been identified at this time. We will continue to monitor patient advancement through interdisciplinary progression rounds. If new patient needs arise, please place a TOC consult.

## 2023-10-11 NOTE — ED Triage Notes (Signed)
 Pt arrived via POV c/o generalized abdominal pain since yesterday. Pt reports she has not had a BM in a few days. Pt reports taking OTC Stool softener and drinking coffee w/o relief.

## 2023-10-11 NOTE — ED Provider Notes (Signed)
 [place] EMERGENCY DEPARTMENT AT [facility] Provider Note   CSN: [id number] Arrival date & time: 10/11/23  1511     History  Chief Complaint  Patient presents with   Abdominal Pain    Erika Kelley is a 53 y.o. female.  Presents to ER complaining of midline upper abdominal pain that started yesterday and has been constant.  Denies exacerbating or alleviating factors.  Denies urinary symptoms, no diarrhea or constipation, no fevers or chills, no pain in her back.  No chest pain or shortness of breath. Started yesterday morning upon waking up, it has stayed the same.  She denies exacerbating leaving symptoms, has not taken any medicine at home for symptoms.  She does have past history of alcohol use disorder but currently is not a drinker.  She has had pancreatitis in the past but states this feels different.     Abdominal Pain      Home Medications Prior to Admission medications   Medication Sig Start Date End Date Taking? Authorizing Provider  aspirin  EC 81 MG tablet Take 1 tablet (81 mg total) by mouth daily. Swallow whole. 05/04/23  Yes Magdalene School, MD  dapagliflozin  propanediol (FARXIGA ) 10 MG TABS tablet Take 1 tablet (10 mg total) by mouth daily before breakfast. 05/16/23  Yes Arleene Belt, PA-C  ENTRESTO  24-26 MG Take 1 tablet by mouth 2 (two) times daily. 09/29/23  Yes [provider]  furosemide  (LASIX ) 40 MG tablet Take 1 tablet (40 mg total) by mouth daily. 05/16/23 10/11/23 Yes Arleene Belt, PA-C  MAGNESIUM -OXIDE 400 (240 Mg) MG tablet Take 1 tablet by mouth daily. 09/29/23  Yes [provider]  Multiple Vitamin (MULTIVITAMIN) tablet Take 1 tablet by mouth daily.   Yes [provider]  nadolol  (CORGARD ) 20 MG tablet Take 1 tablet (20 mg total) by mouth daily. 05/16/23 10/11/23 Yes Arleene Belt, PA-C  nitroGLYCERIN  (NITROSTAT ) 0.4 MG SL tablet Place 1 tablet (0.4 mg total) under the tongue every 5 (five)  minutes x 3 doses as needed for chest pain. 05/03/23 10/11/23 Yes Magdalene School, MD  spironolactone  (ALDACTONE ) 25 MG tablet Take 1 tablet (25 mg total) by mouth daily. 05/16/23 10/11/23 Yes Arleene Belt, PA-C      Allergies    Patient has no known allergies.    Review of Systems   Review of Systems  Gastrointestinal:  Positive for abdominal pain.    Physical Exam Updated Vital Signs BP 109/70   Pulse 66   Temp 98.4 F (36.9 C) (Oral)   Resp 16   Ht 5\' 6"  (1.676 m)   Wt 61.2 kg   LMP 05/25/2015   SpO2 91%   BMI 21.78 kg/m  Physical Exam Vitals and nursing note reviewed.  Constitutional:      General: She is not in acute distress.    Appearance: She is well-developed.     Comments: Patient appears uncomfortable, lying on her right side  HENT:     Head: Normocephalic and atraumatic.  Eyes:     Conjunctiva/sclera: Conjunctivae normal.  Cardiovascular:     Rate and Rhythm: Normal rate and regular rhythm.     Heart sounds: No murmur heard. Pulmonary:     Effort: Pulmonary effort is normal. No respiratory distress.     Breath sounds: Normal breath sounds.  Abdominal:     Palpations: Abdomen is soft.     Tenderness: There is abdominal tenderness in the epigastric area and periumbilical area. There  is no right CVA tenderness or left CVA tenderness.  Musculoskeletal:        General: No swelling.     Cervical back: Neck supple.  Skin:    General: Skin is warm and dry.     Capillary Refill: Capillary refill takes less than 2 seconds.  Neurological:     General: No focal deficit present.     Mental Status: She is alert.  Psychiatric:        Mood and Affect: Mood normal.     ED Results / Procedures / Treatments   Labs (all labs ordered are listed, but only abnormal results are displayed) Labs Reviewed  LIPASE, BLOOD - Abnormal; Notable for the following components:      Result Value   Lipase 170 (*)    All other components within normal limits  COMPREHENSIVE  METABOLIC PANEL WITH GFR - Abnormal; Notable for the following components:   Sodium 132 (*)    Potassium 2.8 (*)    Chloride 93 (*)    Glucose, Bld 138 (*)    BUN <5 (*)    All other components within normal limits  CBC - Abnormal; Notable for the following components:   RBC 5.35 (*)    All other components within normal limits  URINALYSIS, ROUTINE W REFLEX MICROSCOPIC - Abnormal; Notable for the following components:   Color, Urine AMBER (*)    Glucose, UA 150 (*)    Protein, ur 30 (*)    All other components within normal limits  MAGNESIUM     EKG EKG Interpretation Date/Time:  Tuesday Oct 11 2023 17:05:01 EDT Ventricular Rate:  58 PR Interval:  121 QRS Duration:  99 QT Interval:  520 QTC Calculation: 511 R Axis:   76  Text Interpretation: Sinus rhythm ST elev, probable normal early repol pattern Prolonged QT interval Confirmed by Annita Kindle [phone]) on 10/11/2023 5:17:25 PM  Radiology CT ABDOMEN PELVIS W CONTRAST Result Date: 10/11/2023 CLINICAL DATA:  Abdominal pain EXAM: CT ABDOMEN AND PELVIS WITH CONTRAST TECHNIQUE: Multidetector CT imaging of the abdomen and pelvis was performed using the standard protocol following bolus administration of intravenous contrast. RADIATION DOSE REDUCTION: This exam was performed according to the departmental dose-optimization program which includes automated exposure control, adjustment of the mA and/or kV according to patient size and/or use of iterative reconstruction technique. CONTRAST:  OMNIPAQUE  IOHEXOL  300 MG/ML  SOLN COMPARISON:  None Available. FINDINGS: Lower chest: No acute findings. Hepatobiliary: Diffuse low-density throughout the liver compatible with fatty infiltration. No focal abnormality. Gallbladder unremarkable. Pancreas: No focal abnormality or ductal dilatation. Fluid noted adjacent 2 the pancreas. Recommend clinical correlation to exclude acute pancreatitis. Spleen: No focal abnormality.  Normal size. Adrenals/Urinary  Tract: No adrenal abnormality. No focal renal abnormality. No stones or hydronephrosis. Urinary bladder is unremarkable. Stomach/Bowel: Stomach, large and small bowel grossly unremarkable. Normal appendix. Vascular/Lymphatic: Aortic atherosclerosis. No evidence of aneurysm or adenopathy. Reproductive: Uterus and adnexa unremarkable.  No mass. Other: No free fluid or free air. Musculoskeletal: No acute bony abnormality. IMPRESSION: Hepatic steatosis. Slight fluid adjacent to the pancreas raises the possibility of acute pancreatitis. Recommend correlation with laboratory values. Aortic atherosclerosis. Electronically Signed   By: Janeece Mechanic M.D.   On: 10/11/2023 17:30    Procedures Procedures    Medications Ordered in ED Medications  potassium chloride  10 mEq in 100 mL IVPB (10 mEq Intravenous New Bag/Given 10/11/23 1907)  potassium chloride  SA (KLOR-CON  M) CR tablet 40 mEq (40 mEq Oral Given  10/11/23 1643)  morphine  (PF) 4 MG/ML injection 4 mg (4 mg Intravenous Given 10/11/23 1705)  pantoprazole  (PROTONIX ) injection 40 mg (40 mg Intravenous Given 10/11/23 1705)  iohexol  (OMNIPAQUE ) 300 MG/ML solution 100 mL (100 mLs Intravenous Contrast Given 10/11/23 1717)  sodium chloride  0.9 % bolus 500 mL (500 mLs Intravenous Bolus from Bag 10/11/23 1753)  morphine  (PF) 4 MG/ML injection 4 mg (4 mg Intravenous Given 10/11/23 1827)    ED Course/ Medical Decision Making/ A&P                                 Medical Decision Making This patient presents to the ED for concern of abdominal pain since today morning, this involves an extensive number of treatment options, and is a complaint that carries with it a high risk of complications and morbidity.  The differential diagnosis includes pancreatitis, cholecystitis, gastritis, gastroenteritis, ACS, other   Co morbidities that complicate the patient evaluation :   History of pancreatitis, long QT syndrome   Additional history obtained:  Additional history obtained  from EMR External records from outside source obtained and reviewed including prior notes and labs   Lab Tests:  I Ordered, and personally interpreted labs.  The pertinent results include: CBC-reassuring Lipase-elevated at 170 UA-no UTI CMP-mild hyponatremia 132, potassium low at 2.8, chloride 93, normal kidney function, Magnesium  normal   Imaging Studies ordered:  I ordered imaging studies including ct abdomen pelvis which shows likely pancreatitis I independently visualized and interpreted imaging within scope of identifying emergent findings  I agree with the radiologist interpretation   Cardiac Monitoring: / EKG:  The patient was maintained on a cardiac monitor.  I personally viewed and interpreted the cardiac monitored which showed an underlying rhythm of: sinus rhythm   Consultations Obtained:  I requested consultation with the hospitalist,  and discussed lab and imaging findings as well as pertinent plan - they recommend: admission   Problem List / ED Course / Critical interventions / Medication management   With 2 days of epigastric abdominal pain and tenderness, she has hypokalemia noted on labs, CT significant for likely pancreatitis, this correlates with patient's elevated lipase.  Discussed with patient she is required multiple doses with pain medicine, will plan on admission.  She is agreeable with this. I ordered medication including morphine   for pain  Reevaluation of the patient after these medicines showed that the patient improved I have reviewed the patients home medicines and have made adjustments as needed      Amount and/or Complexity of Data Reviewed Labs: ordered. Radiology: ordered.  Risk Prescription drug management. Decision regarding hospitalization.           Final Clinical Impression(s) / ED Diagnoses Final diagnoses:  Acute pancreatitis without infection or necrosis, unspecified pancreatitis type    Rx / DC Orders ED  Discharge Orders     None         Aimee Houseman, PA-C 10/11/23 1933    Merdis Stalling, MD 10/11/23 1946

## 2023-10-12 DIAGNOSIS — K8521 Alcohol induced acute pancreatitis with uninfected necrosis: Secondary | ICD-10-CM | POA: Diagnosis not present

## 2023-10-12 DIAGNOSIS — K859 Acute pancreatitis without necrosis or infection, unspecified: Secondary | ICD-10-CM | POA: Diagnosis not present

## 2023-10-12 LAB — COMPREHENSIVE METABOLIC PANEL WITH GFR
ALT: 27 U/L (ref 0–44)
AST: 28 U/L (ref 15–41)
Albumin: 3.4 g/dL — ABNORMAL LOW (ref 3.5–5.0)
Alkaline Phosphatase: 69 U/L (ref 38–126)
Anion gap: 10 (ref 5–15)
BUN: 5 mg/dL — ABNORMAL LOW (ref 6–20)
CO2: 26 mmol/L (ref 22–32)
Calcium: 9 mg/dL (ref 8.9–10.3)
Chloride: 100 mmol/L (ref 98–111)
Creatinine, Ser: 0.59 mg/dL (ref 0.44–1.00)
GFR, Estimated: >60 mL/min (ref >60)
Glucose, Bld: 97 mg/dL (ref 70–99)
Potassium: 3.9 mmol/L (ref 3.5–5.1)
Sodium: 136 mmol/L (ref 135–145)
Total Bilirubin: 1.1 mg/dL (ref 0.0–1.2)
Total Protein: 7.1 g/dL (ref 6.5–8.1)

## 2023-10-12 LAB — LIPID PANEL
Cholesterol: 122 mg/dL (ref 0–200)
HDL: 55 mg/dL (ref >40)
LDL Cholesterol: 54 mg/dL (ref 0–99)
Total CHOL/HDL Ratio: 2.2 ratio
Triglycerides: 63 mg/dL (ref <150)
VLDL: 13 mg/dL (ref 0–40)

## 2023-10-12 LAB — HEMOGLOBIN A1C
Hgb A1c MFr Bld: 4.6 % — ABNORMAL LOW (ref 4.8–5.6)
Mean Plasma Glucose: 85.32 mg/dL

## 2023-10-12 LAB — LIPASE, BLOOD: Lipase: 136 U/L — ABNORMAL HIGH (ref 11–51)

## 2023-10-12 NOTE — Plan of Care (Signed)

## 2023-10-12 NOTE — Hospital Course (Signed)
 Erika Kelley is a 53 y.o. female with medical history significant of CAD, SCHF, HTN, Vtach w/ electrolyte abnormality.  Pt presenting w/ rapid onset ABD pain. Started day prior to admission. Small amount of ETOH day prior. Epigastric w/ associated nausea. No emesis. Complete loss of appetite dueing this time. Has h/o prior pancreatitis and states sx feel the same.        ED Course: Workup as below showing pancreatitis on imaging and labs.   Assessment/Plan Principal Problem:   Pancreatitis Active Problems:   Primary hypertension   Heart failure with improved ejection fraction (HFimpEF) (HCC)   Electrolyte abnormality   Pancreatitis:  Suspect secondary to ETOH intake. Lipase 170. CT w/o cholelithiasis.  - NPO - sips w/ meds.  - LR overnight x 12 hrs.  - IV Pain mgt - FLP: Lipase 170, AST 35, ALT 34, alk phos 77 -Triglycerides  63   Electrolyte abnormality: secondary to poor po. NA 132. K 2.8, Cl 93. NS bolus and K given in ED.  - IVF as above - Bmp in AM   sCHF: compensated. EF 35%.  - Continue home entresto , Farxiga , Spironolactone  - Hold Lasix  dose on 10/12/23  iven.

## 2023-10-12 NOTE — Discharge Summary (Signed)
 Physician Discharge Summary   Patient: Erika Kelley MRN: [id number] DOB: 1970-11-09  Admit date:     10/11/2023  Discharge date: 10/12/23  Discharge Physician: Bobbetta Burnet   PCP: [facility]   Recommendations at discharge:   Follow-up with PCP in 1 week Advance diet slowly Patient has been advised to avoid alcohol and alcohol products  Discharge Diagnoses: Principal Problem:   Pancreatitis Active Problems:   Primary hypertension   Heart failure with improved ejection fraction (HFimpEF) (HCC)   Electrolyte abnormality    Hospital Course: Erika Kelley is a 53 y.o. female with medical history significant of CAD, SCHF, HTN, Vtach w/ electrolyte abnormality.  Pt presenting w/ rapid onset ABD pain. Started day prior to admission. Small amount of ETOH day prior. Epigastric w/ associated nausea. No emesis. Complete loss of appetite dueing this time. Has h/o prior pancreatitis and states sx feel the same.        ED Course: Workup as below showing pancreatitis on imaging and labs.      Pancreatitis:  Suspect secondary to ETOH intake. Lipase 170. CT w/o cholelithiasis.  - No signs of alcohol withdrawal, abdominal pain improved, status post IV fluid resuscitation, improving lipase levels -Advancing diet, tolerating well - LR overnight x 12 hrs.  - IV Pain mgt    Latest Ref Rng & Units 10/12/2023    4:59 AM 10/11/2023    3:40 PM 05/10/2023    3:15 PM  Hepatic Function  Total Protein 6.5 - 8.1 g/dL 7.1  8.1  7.4   Albumin 3.5 - 5.0 g/dL 3.4  3.9  3.3   AST 15 - 41 U/L 28  35  74   ALT 0 - 44 U/L 27  35  68   Alk Phosphatase 38 - 126 U/L 69  77  138   Total Bilirubin 0.0 - 1.2 mg/dL 1.1  0.8  0.7      - FLP: Lipase 170, >>> 136  -Triglycerides  63   Electrolyte abnormality: secondary to poor po. NA 132. K 2.8, Cl 93. NS bolus and K given in ED.  - Status post IV fluid resuscitation, electrolyte replacement,-all improved   sCHF:  compensated. EF 35%.  - Continue home entresto , Farxiga , Spironolactone  - Was holding Lasix  dose on 10/12/23-to be resumed     Disposition: Home Diet recommendation:  Discharge Diet Orders (From admission, onward)     Start     Ordered   10/12/23 0000  Diet - low sodium heart healthy        10/12/23 1029           Cardiac diet DISCHARGE MEDICATION: Allergies as of 10/12/2023   No Known Allergies      Medication List     PAUSE taking these medications    dapagliflozin  propanediol 10 MG Tabs tablet Wait to take this until: Oct 17, 2023 Commonly known as: Farxiga  Take 1 tablet (10 mg total) by mouth daily before breakfast.       TAKE these medications    aspirin  EC 81 MG tablet Take 1 tablet (81 mg total) by mouth daily. Swallow whole.   Entresto  24-26 MG Generic drug: sacubitril -valsartan  Take 1 tablet by mouth 2 (two) times daily.   furosemide  40 MG tablet Commonly known as: LASIX  Take 1 tablet (40 mg total) by mouth daily.   MAGnesium -Oxide 400 (240 Mg) MG tablet Generic drug: magnesium  oxide Take 1 tablet by mouth daily.   multivitamin tablet  Take 1 tablet by mouth daily.   nadolol  20 MG tablet Commonly known as: CORGARD  Take 1 tablet (20 mg total) by mouth daily.   nitroGLYCERIN  0.4 MG SL tablet Commonly known as: NITROSTAT  Place 1 tablet (0.4 mg total) under the tongue every 5 (five) minutes x 3 doses as needed for chest pain.   spironolactone  25 MG tablet Commonly known as: ALDACTONE  Take 1 tablet (25 mg total) by mouth daily.        Discharge Exam: Filed Weights   10/11/23 1524 10/11/23 1947  Weight: 61.2 kg 64.5 kg        General:  AAO x 3,  cooperative, no distress;   HEENT:  Normocephalic, PERRL, otherwise with in Normal limits   Neuro:  CNII-XII intact. , normal motor and sensation, reflexes intact   Lungs:   Clear to auscultation BL, Respirations unlabored,  No wheezes / crackles  Cardio:    S1/S2, RRR, No murmure, No  Rubs or Gallops   Abdomen:  Soft, non-tender, bowel sounds active all four quadrants, no guarding or peritoneal signs.  Muscular  skeletal:  Limited exam -global generalized weaknesses - in bed, able to move all 4 extremities,   2+ pulses,  symmetric, No pitting edema  Skin:  Dry, warm to touch, negative for any Rashes,  Wounds: Please see nursing documentation          Condition at discharge: good  The results of significant diagnostics from this hospitalization (including imaging, microbiology, ancillary and laboratory) are listed below for reference.   Imaging Studies: CT ABDOMEN PELVIS W CONTRAST Result Date: 10/11/2023 CLINICAL DATA:  Abdominal pain EXAM: CT ABDOMEN AND PELVIS WITH CONTRAST TECHNIQUE: Multidetector CT imaging of the abdomen and pelvis was performed using the standard protocol following bolus administration of intravenous contrast. RADIATION DOSE REDUCTION: This exam was performed according to the departmental dose-optimization program which includes automated exposure control, adjustment of the mA and/or kV according to patient size and/or use of iterative reconstruction technique. CONTRAST:  OMNIPAQUE  IOHEXOL  300 MG/ML  SOLN COMPARISON:  None Available. FINDINGS: Lower chest: No acute findings. Hepatobiliary: Diffuse low-density throughout the liver compatible with fatty infiltration. No focal abnormality. Gallbladder unremarkable. Pancreas: No focal abnormality or ductal dilatation. Fluid noted adjacent 2 the pancreas. Recommend clinical correlation to exclude acute pancreatitis. Spleen: No focal abnormality.  Normal size. Adrenals/Urinary Tract: No adrenal abnormality. No focal renal abnormality. No stones or hydronephrosis. Urinary bladder is unremarkable. Stomach/Bowel: Stomach, large and small bowel grossly unremarkable. Normal appendix. Vascular/Lymphatic: Aortic atherosclerosis. No evidence of aneurysm or adenopathy. Reproductive: Uterus and adnexa  unremarkable.  No mass. Other: No free fluid or free air. Musculoskeletal: No acute bony abnormality. IMPRESSION: Hepatic steatosis. Slight fluid adjacent to the pancreas raises the possibility of acute pancreatitis. Recommend correlation with laboratory values. Aortic atherosclerosis. Electronically Signed   By: Janeece Mechanic M.D.   On: 10/11/2023 17:30    Microbiology: Results for orders placed or performed during the hospital encounter of 04/29/23  MRSA Next Gen by PCR, Nasal     Status: None   Collection Time: 04/29/23  5:59 PM   Specimen: Nasal Mucosa; Nasal Swab  Result Value Ref Range Status   MRSA by PCR Next Gen NOT DETECTED NOT DETECTED Final    Comment: (NOTE) The GeneXpert MRSA Assay (FDA approved for NASAL specimens only), is one component of a comprehensive MRSA colonization surveillance program. It is not intended to diagnose MRSA infection nor to guide or monitor treatment for MRSA  infections. Test performance is not FDA approved in patients less than 62 years old. Performed at [facility], [address], [place], [place] [address]   Respiratory (~20 pathogens) panel by PCR     Status: None   Collection Time: 04/29/23  8:51 PM   Specimen: Nasopharyngeal Swab; Respiratory  Result Value Ref Range Status   Adenovirus NOT DETECTED NOT DETECTED Final   Coronavirus 229E NOT DETECTED NOT DETECTED Final    Comment: (NOTE) The Coronavirus on the Respiratory Panel, DOES NOT test for the novel  Coronavirus (2019 nCoV)    Coronavirus HKU1 NOT DETECTED NOT DETECTED Final   Coronavirus NL63 NOT DETECTED NOT DETECTED Final   Coronavirus OC43 NOT DETECTED NOT DETECTED Final   Metapneumovirus NOT DETECTED NOT DETECTED Final   Rhinovirus / Enterovirus NOT DETECTED NOT DETECTED Final   Influenza A NOT DETECTED NOT DETECTED Final   Influenza B NOT DETECTED NOT DETECTED Final   Parainfluenza Virus 1 NOT DETECTED NOT DETECTED Final   Parainfluenza Virus 2 NOT DETECTED NOT  DETECTED Final   Parainfluenza Virus 3 NOT DETECTED NOT DETECTED Final   Parainfluenza Virus 4 NOT DETECTED NOT DETECTED Final   Respiratory Syncytial Virus NOT DETECTED NOT DETECTED Final   Bordetella pertussis NOT DETECTED NOT DETECTED Final   Bordetella Parapertussis NOT DETECTED NOT DETECTED Final   Chlamydophila pneumoniae NOT DETECTED NOT DETECTED Final   Mycoplasma pneumoniae NOT DETECTED NOT DETECTED Final    Comment: Performed at [facility], [address], [place], [place] [address]  SARS CORONAVIRUS 2 (TAT 6-24 HRS) Anterior Nasal Swab     Status: None   Collection Time: 04/30/23 10:51 AM   Specimen: Anterior Nasal Swab  Result Value Ref Range Status   SARS Coronavirus 2 NEGATIVE NEGATIVE Final    Comment: (NOTE) SARS-CoV-2 target nucleic acids are NOT DETECTED.  The SARS-CoV-2 RNA is generally detectable in upper and lower respiratory specimens during the acute phase of infection. Negative results do not preclude SARS-CoV-2 infection, do not rule out co-infections with other pathogens, and should not be used as the sole basis for treatment or other patient management decisions. Negative results must be combined with clinical observations, patient history, and epidemiological information. The expected result is Negative.  Fact Sheet for Patients: HairSlick.no  Fact Sheet for Healthcare Providers: quierodirigir.com  This test is not yet approved or cleared by the [place]  FDA and  has been authorized for detection and/or diagnosis of SARS-CoV-2 by FDA under an Emergency Use Authorization (EUA). This EUA will remain  in effect (meaning this test can be used) for the duration of the COVID-19 declaration under Se ction 564(b)(1) of the Act, 21 U.S.C. section 360bbb-3(b)(1), unless the authorization is terminated or revoked sooner.  Performed at [facility], [address], [place],  [place] [address]   Culture, blood (Routine X 2) w Reflex to ID Panel     Status: None   Collection Time: 04/30/23  3:19 PM   Specimen: BLOOD LEFT ARM  Result Value Ref Range Status   Specimen Description BLOOD LEFT ARM  Final   Special Requests   Final    BOTTLES DRAWN AEROBIC ONLY Blood Culture adequate volume   Culture   Final    NO GROWTH 5 DAYS Performed at [facility], [address] [place], [place] [address]    Report Status 05/05/2023 FINAL  Final  Culture, blood (Routine X 2) w Reflex to ID  Panel     Status: None   Collection Time: 04/30/23  3:27 PM   Specimen: BLOOD RIGHT HAND  Result Value Ref Range Status   Specimen Description BLOOD RIGHT HAND  Final   Special Requests   Final    BOTTLES DRAWN AEROBIC ONLY Blood Culture results may not be optimal due to an inadequate volume of blood received in culture bottles   Culture   Final    NO GROWTH 5 DAYS Performed at [facility], [address] [place], [place] [address]    Report Status 05/05/2023 FINAL  Final  C Difficile Quick Screen w PCR reflex     Status: None   Collection Time: 04/30/23  6:17 PM   Specimen: STOOL  Result Value Ref Range Status   C Diff antigen NEGATIVE NEGATIVE Final   C Diff toxin NEGATIVE NEGATIVE Final   C Diff interpretation No C. difficile detected.  Final    Comment: Performed at [facility], [address], [place], [place] [address]    Labs: CBC: Recent Labs  Lab 10/11/23 1540  WBC 6.0  HGB 14.7  HCT 44.9  MCV 83.9  PLT 213   Basic Metabolic Panel: Recent Labs  Lab 10/11/23 1540 10/12/23 0459  NA 132* 136  K 2.8* 3.9  CL 93* 100  CO2 27 26  GLUCOSE 138* 97  BUN <5* 5*  CREATININE 0.49 0.59  CALCIUM  9.2 9.0  MG 1.7  --    Liver Function Tests: Recent Labs  Lab 10/11/23 1540 10/12/23 0459  AST 35 28  ALT 35 27  ALKPHOS 77 69  BILITOT 0.8 1.1  PROT 8.1 7.1  ALBUMIN 3.9 3.4*   CBG: No results for input(s): "GLUCAP" in the last 168  hours.  Discharge time spent: greater than 30 minutes.  Signed: Bobbetta Burnet, MD [facility] 10/12/2023

## 2023-10-27 ENCOUNTER — Other Ambulatory Visit (HOSPITAL_COMMUNITY): Payer: Self-pay

## 2023-11-02 ENCOUNTER — Other Ambulatory Visit (HOSPITAL_COMMUNITY): Payer: Self-pay

## 2023-12-06 ENCOUNTER — Telehealth: Payer: Self-pay | Admitting: Genetic Counselor

## 2023-12-06 NOTE — Telephone Encounter (Signed)
 Attempted to call Ms. Billy. Left voicemail requesting a callback to discuss updated results of genetic testing.  Kimberly Molt, MS [facility] Certified Genetic Counselor

## 2023-12-26 NOTE — Telephone Encounter (Signed)
 Attempted to call Ms. Glowacki for a second time. Left voicemail requesting a callback to discuss updated results of genetic testing.   Kimberly Molt, MS [facility] Certified Genetic Counselor

## 2024-01-05 NOTE — Telephone Encounter (Signed)
 Spoke with Ms. Billy regarding updated results of her genetic testing.   Ms. Middlesworth was seen in the [facility] clinic on 06/23/2023 at 53 yo due to a personal history of prolonged QT interval and cardiomyopathy as well as a family history of kidney failure.    Her initial genetic testing was negative/normal for all genes tested. However, a recent update in classification has occurred for specific variants in the APOL1 gene, which is associated with kidney failure. Ms. 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 This condition is inherited in an autosomal recessive manner, meaning that two variants are required for an individual to develop symptoms. 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 No changes to medical management are recommended based on this result.   Ms. Chamberlain expressed understanding of these results and was encouraged to reach out with any further questions. The test report has been released to the family and is attached to the associated order.     Kimberly Molt, MS [facility]  Certified Genetic Counselor
